# Patient Record
Sex: Female | Born: 1990 | Race: White | Hispanic: No | Marital: Married | Smoking: Never smoker
Health system: Southern US, Community
[De-identification: ages and names within clinical notes are randomized; demographics above are authoritative.]

## PROBLEM LIST (undated history)

## (undated) DIAGNOSIS — F909 Attention-deficit hyperactivity disorder, unspecified type: Secondary | ICD-10-CM

## (undated) HISTORY — DX: Attention-deficit hyperactivity disorder, unspecified type: F90.9

---

## 2012-12-15 ENCOUNTER — Ambulatory Visit (INDEPENDENT_AMBULATORY_CARE_PROVIDER_SITE_OTHER): Payer: BC Managed Care – PPO | Admitting: Internal Medicine

## 2012-12-15 VITALS — BP 127/81 | HR 74 | Temp 98.1°F | Resp 16 | Ht 63.0 in | Wt 123.2 lb

## 2012-12-15 DIAGNOSIS — F988 Other specified behavioral and emotional disorders with onset usually occurring in childhood and adolescence: Secondary | ICD-10-CM

## 2012-12-15 MED ORDER — AMPHETAMINE-DEXTROAMPHETAMINE 10 MG PO TABS: 10.0000 mg | ORAL_TABLET | Freq: Two times a day (BID) | ORAL | Status: DC

## 2012-12-15 NOTE — Progress Notes (Signed)
  Subjective:    Patient ID: Heather Munoz, female    DOB: 1991/02/25, 21 y.o.   MRN: [id number]  HPI referred by her sisters for evaluation for possible attention deficit  3rd yr [employer]  Biology.?nutrition-over this past semester, after all A's in high school, great SAT scores and good grades the first 2 years of college, she has started having trouble with completion of homework in a timely fashion, and with attending to lectures and maintaining an ability to stay focused and take class notes. She hurries through tests and makes careless errors at times. The burden of her cumulative academic work is now interfering with adequate sleep. Sisters both ADD She is the quiet one/not a whiner/Mom often pushes things on her when th eothers complain Trouble misplacing everything/loses place in [employer] track in lectures Inefficient with homework staying up way too late Others say her "mind is elsewhere"  There are no other medical issues She is on oral contraceptives/no relationship issues  No illicit drugs/no risk behavior No depression or anxiety although she does avoid coming home because of the interaction with her mother Father left when she was an infant/she had contact with him through age 43 but very little since/she does not see this as a problem     Review of Systems  Constitutional: Negative for fatigue and unexpected weight change.       Remains very active as a runner/no change in recent activity levels  HENT: Negative for dental problem.   Eyes: Negative for visual disturbance.  Respiratory: Negative for shortness of breath.   Cardiovascular: Negative for chest pain and palpitations.  Gastrointestinal: Negative for abdominal pain.  Genitourinary: Negative for menstrual problem.  Neurological: Negative for headaches.  Psychiatric/Behavioral: Negative for suicidal ideas, hallucinations, behavioral problems, sleep disturbance, self-injury and dysphoric mood. The  patient is not nervous/anxious and is not hyperactive.        Objective:   Physical Exam Vital signs stable HEENT clear No thyromegaly or nodules Heart regular with a rate of 65 and no murmurs or clicks Neurological intact Mood stable/affect appropriate  ASRS: 47 equal highly likely to have ADD There are no compulsions and no disruptive behaviors but a high degree of restlessness, talking too much, finishing sentences of other people. Most markedly there are reports of careless mistakes especially when bored, difficulty concentration, procrastination, misplacing things, and being unable to sit for a long period without restlessness     Assessment & Plan:  Problem #1 attention deficit disorder  To begin a trial of Adderall 10 mg for 4-6 hour class or study., #60 Referred to ADD for dummies/we discussed the value of the way she thinks Call from college with report of medication response so I can arrange refills with followup during a college vacation The focus should be to make her better in class, much more efficient with homework, improved with reading, and improved with procrastination.

## 2012-12-16 ENCOUNTER — Encounter: Payer: Self-pay | Admitting: Internal Medicine

## 2012-12-16 DIAGNOSIS — F988 Other specified behavioral and emotional disorders with onset usually occurring in childhood and adolescence: Secondary | ICD-10-CM | POA: Insufficient documentation

## 2013-03-17 ENCOUNTER — Telehealth: Payer: Self-pay

## 2013-03-17 DIAGNOSIS — F988 Other specified behavioral and emotional disorders with onset usually occurring in childhood and adolescence: Secondary | ICD-10-CM

## 2013-03-17 MED ORDER — AMPHETAMINE-DEXTROAMPHETAMINE 10 MG PO TABS: 10.0000 mg | ORAL_TABLET | Freq: Two times a day (BID) | ORAL | Status: DC

## 2013-03-17 NOTE — Telephone Encounter (Signed)
Meds ordered this encounter  Medications  . amphetamine-dextroamphetamine (ADDERALL) 10 MG tablet    Sig: Take 1 tablet (10 mg total) by mouth 2 (two) times daily.    Dispense:  60 tablet    Refill:  0   F/u summer

## 2013-03-17 NOTE — Telephone Encounter (Signed)
Pended please advise.  

## 2013-03-17 NOTE — Telephone Encounter (Signed)
Refill amphetamine-dextroamphetamine (ADDERALL) 10 MG tablet   [phone] Clide Dales - Mother

## 2013-03-20 NOTE — Telephone Encounter (Signed)
Mom advised

## 2013-05-25 ENCOUNTER — Telehealth: Payer: Self-pay

## 2013-05-25 DIAGNOSIS — F988 Other specified behavioral and emotional disorders with onset usually occurring in childhood and adolescence: Secondary | ICD-10-CM

## 2013-05-25 NOTE — Telephone Encounter (Signed)
Pended please advise.  

## 2013-05-25 NOTE — Telephone Encounter (Signed)
Patients mother Misty Stanley called requesting a refill for her daughters Adderall. Please call back at [phone] for further questions. Thanks

## 2013-05-29 MED ORDER — AMPHETAMINE-DEXTROAMPHETAMINE 10 MG PO TABS: 10.0000 mg | ORAL_TABLET | Freq: Two times a day (BID) | ORAL | Status: DC

## 2013-05-29 NOTE — Telephone Encounter (Signed)
Needs f/u before further rx after this Meds ordered this encounter  Medications  . amphetamine-dextroamphetamine (ADDERALL) 10 MG tablet    Sig: Take 1 tablet (10 mg total) by mouth 2 (two) times daily.    Dispense:  60 tablet    Refill:  0    Needs follow up for refills.

## 2013-05-30 NOTE — Telephone Encounter (Signed)
Pt mom notified that rx is ready for pickup. rx is in pickup drawer.

## 2013-08-03 ENCOUNTER — Telehealth: Payer: Self-pay

## 2013-08-03 NOTE — Telephone Encounter (Signed)
Med Refills - adderall    [phone]

## 2013-09-08 ENCOUNTER — Telehealth: Payer: Self-pay

## 2013-09-08 DIAGNOSIS — F988 Other specified behavioral and emotional disorders with onset usually occurring in childhood and adolescence: Secondary | ICD-10-CM

## 2013-09-08 NOTE — Telephone Encounter (Signed)
Pt's mother is calling to see about getting her daughters adderal refilled Call back number is [phone]

## 2013-09-08 NOTE — Telephone Encounter (Signed)
Pt advised of need for appt, she is provided Dr Doolittle's hours, as it is best for her to come in on a weekend. She plans to come in next weekend, please advise on refill request, I have pended the medication

## 2013-09-08 NOTE — Telephone Encounter (Signed)
Called her

## 2013-09-08 NOTE — Telephone Encounter (Signed)
In June, was advised that she needs follow-up with Dr. Merla Riches.  What's the plan?

## 2013-09-08 NOTE — Telephone Encounter (Signed)
Left message for patient to call me back. 

## 2013-09-12 MED ORDER — AMPHETAMINE-DEXTROAMPHETAMINE 10 MG PO TABS: 10.0000 mg | ORAL_TABLET | Freq: Two times a day (BID) | ORAL | Status: DC

## 2013-09-12 NOTE — Telephone Encounter (Signed)
Pt herself CB to check status of Rx. Dr Merla Riches, have you seen this request? It looks like it was routed but I didn't see it in your in basket. Please advise and I will call pt back.

## 2013-09-12 NOTE — Telephone Encounter (Signed)
Patient's mother notified and voiced understanding.

## 2013-09-12 NOTE — Telephone Encounter (Signed)
Meds ordered this encounter  Medications  . amphetamine-dextroamphetamine (ADDERALL) 10 MG tablet    Sig: Take 1 tablet (10 mg total) by mouth 2 (two) times daily.    Dispense:  60 tablet    Refill:  0    Needs follow up for refills.

## 2013-09-13 NOTE — Telephone Encounter (Signed)
No further action needed.

## 2013-10-24 ENCOUNTER — Ambulatory Visit (INDEPENDENT_AMBULATORY_CARE_PROVIDER_SITE_OTHER): Payer: BC Managed Care – PPO | Admitting: Internal Medicine

## 2013-10-24 VITALS — BP 102/68 | HR 70 | Temp 97.3°F | Resp 16 | Ht 64.0 in | Wt 124.0 lb

## 2013-10-24 DIAGNOSIS — F988 Other specified behavioral and emotional disorders with onset usually occurring in childhood and adolescence: Secondary | ICD-10-CM

## 2013-10-24 MED ORDER — AMPHETAMINE-DEXTROAMPHETAMINE 10 MG PO TABS: 10.0000 mg | ORAL_TABLET | Freq: Two times a day (BID) | ORAL | Status: DC

## 2013-10-24 NOTE — Progress Notes (Signed)
Subjective:    Patient ID: Heather Munoz, female    DOB: Apr 16, 1991, 22 y.o.   MRN: [id number] This chart was scribed for Dr. Merla Riches by Valera Castle, ED Scribe. This patient was seen in room 05 and the patient's care was started at 9:18 AM.  HPI Heather Munoz is a 22 y.o. female with a h/o ADD who presents to the [facility] for a follow up. She reports being unaware it was time for her to revisit. She reports she was first seen here last 11/2012. She confirms calling here several times to get her Rx refills.  She reports that her grades have been improving due to the medication, and that it is not as hard to focus on her tasks. She states she doesn't lose track of time as much as she used to.  She states she has been taking it once a day. She reports that she was told if she needs to she can take the medication twice a day, but does not usually take it more than once a day. She reports that sometimes she feels foggy, during the wear off phase. She states it takes about 5 hours for the medication to wear off.   She denies any other complaints, stating being healthy otherwise. She states she hopes to attend graduate school soon. 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 Attention deficit disorder 12/16/2012   History reviewed. No pertinent past medical history. History reviewed. No pertinent past surgical history. No Known Allergies Prior to Admission medications   Medication Sig Start Date End Date Taking? Authorizing Provider  amphetamine-dextroamphetamine (ADDERALL) 10 MG tablet Take 1 tablet (10 mg total) by mouth 2 (two) times daily. 09/08/13  Yes Tonye Pearson, MD  norethindrone-ethinyl estradiol (JUNEL FE,GILDESS FE,LOESTRIN FE) 1-20 MG-MCG tablet Take 1 tablet by mouth daily.   Yes Historical Provider, MD    Review of Systems     Objective:   Physical Exam  Constitutional: She is oriented to person,  place, and time. She appears well-developed and well-nourished.  HENT:  Head: Normocephalic and atraumatic.  Eyes: Conjunctivae and EOM are normal. Pupils are equal, round, and reactive to light.  Cardiovascular: Normal rate and regular rhythm.   Pulmonary/Chest: Effort normal.  Neurological: She is alert and oriented to person, place, and time. No cranial nerve deficit.  Psychiatric: She has a normal mood and affect. Her behavior is normal. Judgment and thought content normal.    Filed Vitals:   10/24/13 0830  BP: 102/68  Pulse: 70  Temp: 97.3 F (36.3 C)  TempSrc: Oral  Resp: 16  Height: 5\' 4"  (1.626 m)  Weight: 124 lb (56.246 kg)  SpO2: 97%       Assessment & Plan:   ADD (attention deficit disorder) - Plan: amphetamine-dextroamphetamine (ADDERALL) 10 MG tablet, amphetamine-dextroamphetamine (ADDERALL) 10 MG tablet, amphetamine-dextroamphetamine (ADDERALL) 10 MG tablet  Meds ordered this encounter  Medications  . amphetamine-dextroamphetamine (ADDERALL) 10 MG tablet    Sig: Take 1 tablet (10 mg total) by mouth 2 (two) times daily.    Dispense:  60 tablet    Refill:  0      . amphetamine-dextroamphetamine (ADDERALL) 10 MG tablet    Sig: Take 1 tablet (10 mg total) by mouth 2 (two) times daily.    Dispense:  60 tablet    Refill:  0   11/23/13  . amphetamine-dextroamphetamine (ADDERALL) 10 MG tablet  Sig: Take 1 tablet (10 mg total) by mouth 2 (two) times daily.    Dispense:  60 tablet    Refill:  0    12/24/13   Call for more in 3-4 mos F/u prior to grad school   I have completed the patient encounter in its entirety as documented by the scribe, with editing by me where necessary. Madelline Eshbach P. Merla Riches, M.D.

## 2014-04-06 ENCOUNTER — Telehealth: Payer: Self-pay

## 2014-04-06 DIAGNOSIS — F988 Other specified behavioral and emotional disorders with onset usually occurring in childhood and adolescence: Secondary | ICD-10-CM

## 2014-04-06 MED ORDER — AMPHETAMINE-DEXTROAMPHETAMINE 10 MG PO TABS: 10.0000 mg | ORAL_TABLET | Freq: Two times a day (BID) | ORAL | Status: DC

## 2014-04-06 NOTE — Telephone Encounter (Signed)
Mom called; daughter needs refill on Adderrall. Mom will pick up when ready; [facility]  Please call Romero BellingStacey Beard at [phone]

## 2014-04-07 NOTE — Telephone Encounter (Signed)
lmom that rx is ready for pickup.  

## 2014-09-17 ENCOUNTER — Ambulatory Visit (INDEPENDENT_AMBULATORY_CARE_PROVIDER_SITE_OTHER): Payer: BC Managed Care – PPO | Admitting: Internal Medicine

## 2014-09-17 VITALS — BP 110/70 | HR 79 | Temp 98.5°F | Resp 14 | Ht 63.5 in | Wt 130.6 lb

## 2014-09-17 DIAGNOSIS — F988 Other specified behavioral and emotional disorders with onset usually occurring in childhood and adolescence: Secondary | ICD-10-CM

## 2014-09-17 MED ORDER — AMPHETAMINE-DEXTROAMPHETAMINE 10 MG PO TABS: 10.0000 mg | ORAL_TABLET | Freq: Two times a day (BID) | ORAL | Status: DC

## 2014-09-17 NOTE — Progress Notes (Signed)
Subjective:   This chart was scribed for Heather Sia, MD by Arlan Organ, [facility] Scribe. This patient was seen in room 2 and the patient's care was started 6:10 PM.    Patient ID: Heather Munoz, female    DOB: [birth year], 23 y.o.   MRN: [id number]  Chief Complaint  Patient presents with   Medication Refill    Adderall    HPI  HPI Comments: Heather Munoz is a 23 y.o. female who presents to [facility] here for Adderall medication refill today. Last refill November 2014. Pt states she is healthy otherwise. Heather Munoz plans to apply to gradate school next year and is currently working at [employer]. She is now preparing to take the GRE prior to admission to graduate school. Daily dose varies dep on need. No side effects  Doing well otherwise.  psychosoc stable  Patient Active Problem List   Diagnosis Date Noted   Attention deficit disorder 12/16/2012   History reviewed. No pertinent past medical history. History reviewed. No pertinent past surgical history. No Known Allergies Prior to Admission medications   Medication Sig Start Date End Date Taking? Authorizing Provider  amphetamine-dextroamphetamine (ADDERALL) 10 MG tablet Take 1 tablet (10 mg total) by mouth 2 (two) times daily. For 60 d after signed 04/06/14  Yes Tonye Pearson, MD  amphetamine-dextroamphetamine (ADDERALL) 10 MG tablet Take 1 tablet (10 mg total) by mouth 2 (two) times daily. For 30d after signed 04/06/14  Yes Tonye Pearson, MD  amphetamine-dextroamphetamine (ADDERALL) 10 MG tablet Take 1 tablet (10 mg total) by mouth 2 (two) times daily. 04/06/14  Yes Tonye Pearson, MD  norethindrone-ethinyl estradiol (JUNEL FE,GILDESS FE,LOESTRIN FE) 1-20 MG-MCG tablet Take 1 tablet by mouth daily.   Yes Historical Provider, MD     Review of Systems  Constitutional: Negative for fever and chills.  no HA or vis chg No CP or Palp No  loss appet No tremor or sweating or insomnia  Triage Vitals: BP 110/70   Pulse 79   Temp(Src) 98.5 F (36.9 C) (Oral)   Resp 14   Ht 5' 3.5" (1.613 m)   Wt 130 lb 9.6 oz (59.24 kg)   BMI 22.77 kg/m2   SpO2 99%   LMP 09/16/2014   Objective:  Physical Exam  Nursing note and vitals reviewed. Constitutional: She is oriented to person, place, and time. She appears well-developed and well-nourished.  HENT:  Head: Normocephalic.  Eyes: Conjunctivae and EOM are normal. Pupils are equal, round, and reactive to light.  Neck: Normal range of motion.  Pulmonary/Chest: Effort normal.  Neurological: She is alert and oriented to person, place, and time. No cranial nerve deficit.  Psychiatric: She has a normal mood and affect. Her behavior is normal. Judgment and thought content normal.    Assessment & Plan:   I personally performed the services described in this documentation, which was scribed in my presence. The recorded information has been reviewed and is accurate.   ADD (attention deficit disorder) - Plan: amphetamine-dextroamphetamine (ADDERALL) 10 MG tablet, amphetamine-dextroamphetamine (ADDERALL) 10 MG tablet, amphetamine-dextroamphetamine (ADDERALL) 10 MG tablet  Meds ordered this encounter  Medications   amphetamine-dextroamphetamine (ADDERALL) 10 MG tablet    Sig: Take 1 tablet (10 mg total) by mouth 2 (two) times daily. For 60 d after signed    Dispense:  60 tablet    Refill:  0   amphetamine-dextroamphetamine (ADDERALL) 10 MG tablet  Sig: Take 1 tablet (10 mg total) by mouth 2 (two) times daily. For 30d after signed    Dispense:  60 tablet    Refill:  0   amphetamine-dextroamphetamine (ADDERALL) 10 MG tablet    Sig: Take 1 tablet (10 mg total) by mouth 2 (two) times daily.    Dispense:  60 tablet    Refill:  0   Call 3 f/u 70mo

## 2015-01-29 ENCOUNTER — Telehealth: Payer: Self-pay

## 2015-01-29 DIAGNOSIS — F988 Other specified behavioral and emotional disorders with onset usually occurring in childhood and adolescence: Secondary | ICD-10-CM

## 2015-01-29 MED ORDER — AMPHETAMINE-DEXTROAMPHETAMINE 10 MG PO TABS: 10.0000 mg | ORAL_TABLET | Freq: Two times a day (BID) | ORAL | Status: DC

## 2015-01-29 NOTE — Telephone Encounter (Signed)
Meds ordered this encounter  Medications  . amphetamine-dextroamphetamine (ADDERALL) 10 MG tablet    Sig: Take 1 tablet (10 mg total) by mouth 2 (two) times daily.    Dispense:  60 tablet    Refill:  0  . amphetamine-dextroamphetamine (ADDERALL) 10 MG tablet    Sig: Take 1 tablet (10 mg total) by mouth 2 (two) times daily. For 30d after signed    Dispense:  60 tablet    Refill:  0  . amphetamine-dextroamphetamine (ADDERALL) 10 MG tablet    Sig: Take 1 tablet (10 mg total) by mouth 2 (two) times daily. For 60 d after signed    Dispense:  60 tablet    Refill:  0   F/u next refills

## 2015-01-29 NOTE — Telephone Encounter (Signed)
Heather ArnoldStacy states her daughter is in need of her ADDERALL 10mg s. Please call [phone] when ready for pick up

## 2015-01-30 NOTE — Telephone Encounter (Signed)
lmom that rx's are ready to be picked up.

## 2015-03-13 ENCOUNTER — Ambulatory Visit: Payer: Self-pay

## 2015-03-13 ENCOUNTER — Other Ambulatory Visit: Payer: Self-pay | Admitting: Occupational Medicine

## 2015-03-13 DIAGNOSIS — Z021 Encounter for pre-employment examination: Secondary | ICD-10-CM

## 2015-11-18 ENCOUNTER — Encounter: Payer: Self-pay | Admitting: Internal Medicine

## 2016-04-03 IMAGING — CR DG CHEST 1V
1 series · 1 of 1 positions shown · non-contrast
Comparison: None

CLINICAL DATA: Pre-employment physical

EXAM:
CHEST  1 VIEW

[view not recorded]
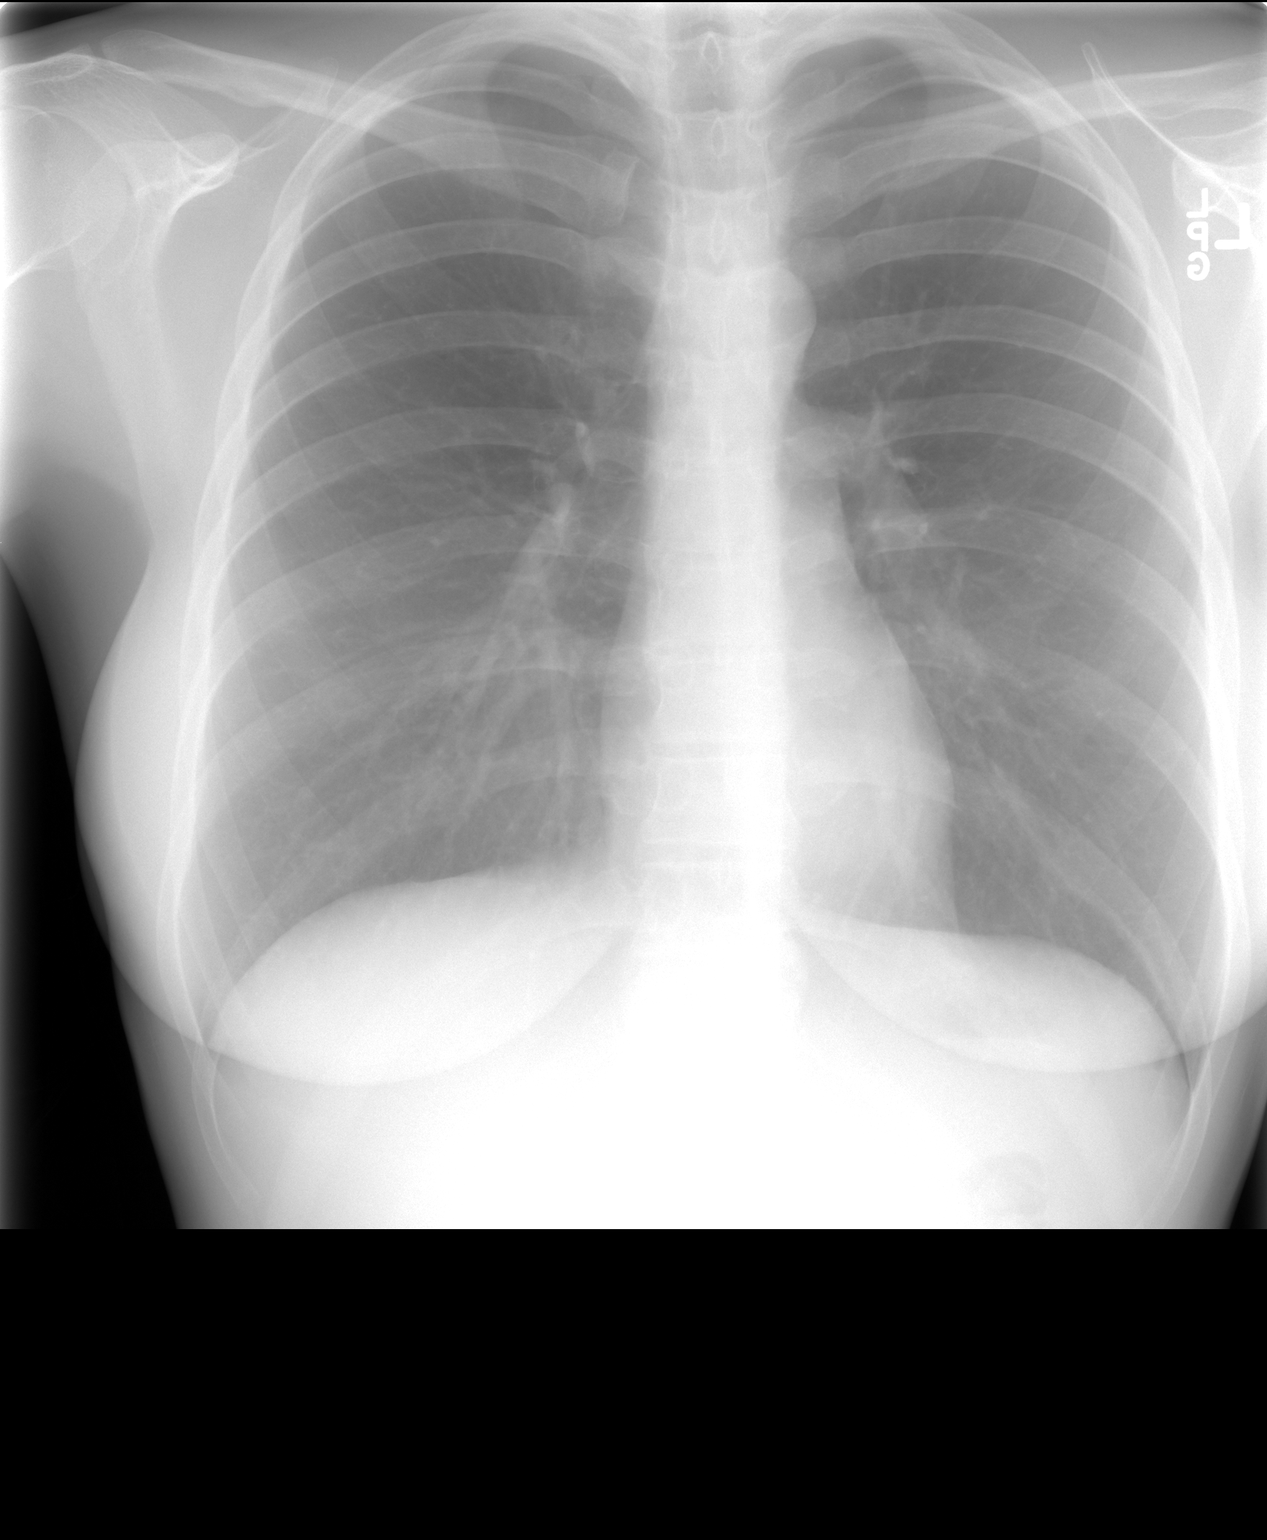

[1 of 1 positions shown; findings below may reference images not displayed]

FINDINGS: Normal heart size, mediastinal contours, and pulmonary vascularity.

Lungs well expanded and clear.

No pleural effusion or pneumothorax.

Bones unremarkable.
IMPRESSION: Normal exam.

## 2016-04-06 ENCOUNTER — Ambulatory Visit (INDEPENDENT_AMBULATORY_CARE_PROVIDER_SITE_OTHER): Payer: BLUE CROSS/BLUE SHIELD | Admitting: Internal Medicine

## 2016-04-06 VITALS — BP 122/74 | HR 69 | Temp 98.0°F | Resp 15 | Ht 63.5 in | Wt 127.2 lb

## 2016-04-06 DIAGNOSIS — F909 Attention-deficit hyperactivity disorder, unspecified type: Secondary | ICD-10-CM

## 2016-04-06 DIAGNOSIS — F988 Other specified behavioral and emotional disorders with onset usually occurring in childhood and adolescence: Secondary | ICD-10-CM

## 2016-04-06 MED ORDER — AMPHETAMINE-DEXTROAMPHETAMINE 10 MG PO TABS: 10.0000 mg | ORAL_TABLET | Freq: Two times a day (BID) | ORAL | Status: DC

## 2016-04-06 NOTE — Patient Instructions (Signed)
     IF you received an x-ray today, you will receive an invoice from [facility]. Please contact [facility] at [phone] with questions or concerns regarding your invoice.   IF you received labwork today, you will receive an invoice from [facility]. Please contact [facility] at [phone] with questions or concerns regarding your invoice.   Our billing staff will not be able to assist you with questions regarding bills from these companies.  You will be contacted with the lab results as soon as they are available. The fastest way to get your results is to activate your My Chart account. Instructions are located on the last page of this paperwork. If you have not heard from us regarding the results in 2 weeks, please contact this office.      

## 2016-04-06 NOTE — Progress Notes (Signed)
By signing my name below I, Shelah Lewandowsky, attest that this documentation has been prepared under the direction and in the presence of Ellamae Sia, MD. Electonically Signed. Shelah Lewandowsky, Scribe 04/06/2016 at 1:31 PM  Subjective:    Patient ID: Heather Munoz, female    DOB: [birth year], 25 y.o.   MRN: [id number] Chief Complaint  Patient presents with  . Medication Refill    Adderall     HPI Heather Munoz is a 25 y.o. female who presents to the [facility] for adderall refill and to discuss new provider options. Pt states she is healthy and has no complaints.   Pt has not been using adderall for the past couple years and has been doing well. 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 Pt is going back to school and would like to start back on adderall for classes.  Just got accepted to [employer] program and starts May 04 2016. Pt will be doing 2 classes a week at night after work.   Both sisters ADD Patient Active Problem List   Diagnosis Date Noted  . Attention deficit disorder 12/16/2012    Current outpatient prescriptions:  .  amphetamine-dextroamphetamine (ADDERALL) 10 MG tablet, Take 1 tablet (10 mg total) by mouth 2 (two) times daily. For 60 d after signed, Disp: 60 tablet, Rfl: 0 .  norethindrone-ethinyl estradiol (JUNEL FE,GILDESS FE,LOESTRIN FE) 1-20 MG-MCG tablet, Take 1 tablet by mouth daily., Disp: , Rfl:   No Known Allergies   Social History   Social History  . Marital Status: Single    Spouse Name: N/A  . Number of Children: N/A  . Years of Education: N/A   Occupational History  . Not on file.   Social History Main Topics  . Smoking status: Never Smoker   . Smokeless tobacco: Not on file  . Alcohol Use: Not on file  . Drug Use: Not on file  . Sexual Activity: Not on file   Other Topics Concern  . Not on file    Social History Narrative      Review of Systems  [place]     Objective:   Physical Exam  Constitutional: She is oriented to person, place, and time. She appears well-developed and well-nourished. No distress.  HENT:  Head: Normocephalic and atraumatic.  Eyes: Conjunctivae and EOM are normal. Pupils are equal, round, and reactive to light.  Neck: Neck supple.  Cardiovascular: Normal rate.   Pulmonary/Chest: Effort normal.  Musculoskeletal: Normal range of motion.  Neurological: She is alert and oriented to person, place, and time. Gait normal.  Skin: Skin is warm and dry.  Psychiatric: She has a normal mood and affect. Her behavior is normal.  Nursing note and vitals reviewed.  Filed Vitals:   04/06/16 1151  BP: 122/74  Pulse: 69  Temp: 98 F (36.7 C)  Resp: 15          Assessment & Plan:  I have completed the patient encounter in its entirety as documented by the scribe, with editing by me where necessary. Jarica Plass P. Merla Riches, M.D.  ADD (attention deficit disorder) - Plan: amphetamine-dextroamphetamine (ADDERALL) 10 MG tablet, amphetamine-dextroamphetamine (ADDERALL) 10 MG tablet, amphetamine-dextroamphetamine (ADDERALL) 10 MG tablet  Meds ordered this encounter  Medications  . amphetamine-dextroamphetamine (ADDERALL) 10 MG tablet    Sig: Take 1 tablet (10 mg total) by  mouth 2 (two) times daily. For 60 d after signed    Dispense:  60 tablet    Refill:  0  . amphetamine-dextroamphetamine (ADDERALL) 10 MG tablet    Sig: Take 1 tablet (10 mg total) by mouth 2 (two) times daily. For 30d after signed    Dispense:  60 tablet    Refill:  0  . amphetamine-dextroamphetamine (ADDERALL) 10 MG tablet    Sig: Take 1 tablet (10 mg total) by mouth 2 (two) times daily.    Dispense:  60 tablet    Refill:  0   F/u S Weber(prob won't need meds but 2 d a week)

## 2016-08-12 ENCOUNTER — Other Ambulatory Visit: Payer: Self-pay

## 2016-08-12 DIAGNOSIS — F988 Other specified behavioral and emotional disorders with onset usually occurring in childhood and adolescence: Secondary | ICD-10-CM

## 2016-08-12 NOTE — Telephone Encounter (Signed)
Patient request a refill of Adderall 10 MG. [phone].

## 2016-08-13 NOTE — Telephone Encounter (Signed)
RTC to establish with new provider or I can prescribe a thirty day supply and get her a referral to [facility]. Please give her the choice.

## 2016-08-13 NOTE — Telephone Encounter (Signed)
Pt last seen for ADD by Merla Richesoolittle 4/17 and given 3 Rxs. I will pend for 3 more.

## 2016-08-14 ENCOUNTER — Telehealth: Payer: Self-pay

## 2016-08-14 ENCOUNTER — Other Ambulatory Visit: Payer: Self-pay | Admitting: Internal Medicine

## 2016-08-14 DIAGNOSIS — F988 Other specified behavioral and emotional disorders with onset usually occurring in childhood and adolescence: Secondary | ICD-10-CM

## 2016-08-14 NOTE — Telephone Encounter (Signed)
LMOM for pt advising that she either RTC to est care with new provider here, call first to set up same day appt.. Or call if she'd rather have referral to [facility] in which case we will start referral and get her a 30 day RF.

## 2016-08-14 NOTE — Telephone Encounter (Signed)
Patient is returning phone call concerning Adderall. [phone].

## 2016-08-15 MED ORDER — AMPHETAMINE-DEXTROAMPHETAMINE 10 MG PO TABS: 10.0000 mg | ORAL_TABLET | Freq: Two times a day (BID) | ORAL | 0 refills | Status: DC

## 2016-08-15 NOTE — Telephone Encounter (Signed)
I will give to patient but she will need to see me before more refills are given

## 2016-08-17 NOTE — Telephone Encounter (Signed)
Rx in drawer, pt notified by Maralyn SagoSarah on [facility]

## 2016-08-17 NOTE — Telephone Encounter (Signed)
Spoke with pt, she states she received Rx already.

## 2016-09-25 ENCOUNTER — Ambulatory Visit: Payer: BLUE CROSS/BLUE SHIELD

## 2016-10-01 ENCOUNTER — Ambulatory Visit (INDEPENDENT_AMBULATORY_CARE_PROVIDER_SITE_OTHER): Payer: BLUE CROSS/BLUE SHIELD | Admitting: Physician Assistant

## 2016-10-01 ENCOUNTER — Encounter: Payer: Self-pay | Admitting: Physician Assistant

## 2016-10-01 DIAGNOSIS — F988 Other specified behavioral and emotional disorders with onset usually occurring in childhood and adolescence: Secondary | ICD-10-CM

## 2016-10-01 MED ORDER — AMPHETAMINE-DEXTROAMPHETAMINE 10 MG PO TABS: 10.0000 mg | ORAL_TABLET | Freq: Two times a day (BID) | ORAL | 0 refills | Status: DC

## 2016-10-01 NOTE — Progress Notes (Signed)
   Heather Munoz  MRN: [id number] DOB: 1991-04-19  Subjective:  Pt presents to clinic for medication refill.  She has been doing well on her current dose.  She takes bid on weekdays and then on weekends she finds that she only takes a dose in the am and she is able to get all her work done.  She finds that at home if she does not take it she starts multiple projects but finishes none of what she starts.    Review of Systems  Constitutional: Negative for appetite change, chills, fever and unexpected weight change.  Cardiovascular: Negative for chest pain and palpitations.    Patient Active Problem List   Diagnosis Date Noted  . Attention deficit disorder 12/16/2012    Current Outpatient Prescriptions on File Prior to Visit  Medication Sig Dispense Refill  . norethindrone-ethinyl estradiol (JUNEL FE,GILDESS FE,LOESTRIN FE) 1-20 MG-MCG tablet Take 1 tablet by mouth daily.     No current facility-administered medications on file prior to visit.     No Known Allergies  Pt patients past, family and social history were reviewed and updated.  Objective:  BP 118/64 (BP Location: Right Arm, Patient Position: Sitting, Cuff Size: Small)   Pulse 76   Temp 98.9 F (37.2 C) (Oral)   Resp 16   Ht 5' 3.5" (1.613 m)   Wt 120 lb (54.4 kg)   LMP 09/17/2016   SpO2 99%   BMI 20.92 kg/m   Physical Exam  Constitutional: She is oriented to person, place, and time and well-developed, well-nourished, and in no distress.  HENT:  Head: Normocephalic and atraumatic.  Right Ear: Hearing and external ear normal.  Left Ear: Hearing and external ear normal.  Eyes: Conjunctivae are normal.  Neck: Normal range of motion.  Cardiovascular: Normal rate, regular rhythm and normal heart sounds.   No murmur heard. Pulmonary/Chest: Effort normal and breath sounds normal. She has no wheezes.  Neurological: She is alert and oriented to person, place, and time. Gait normal.  Skin: Skin is warm and dry.    Psychiatric: Mood, memory, affect and judgment normal.  Vitals reviewed.   Assessment and Plan :  Attention deficit disorder (ADD) without hyperactivity - Plan: amphetamine-dextroamphetamine (ADDERALL) 10 MG tablet, amphetamine-dextroamphetamine (ADDERALL) 10 MG tablet, amphetamine-dextroamphetamine (ADDERALL) 10 MG tablet   Continue with current dose - she will call in 3 months for more Rx   Benny LennertSarah Mitsuo Budnick PA-C  [facility] 10/02/2016 10:10 AM

## 2016-10-01 NOTE — Patient Instructions (Signed)
     IF you received an x-ray today, you will receive an invoice from [facility]. Please contact [facility] at [phone] with questions or concerns regarding your invoice.   IF you received labwork today, you will receive an invoice from [facility]. Please contact [facility] at [phone] with questions or concerns regarding your invoice.   Our billing staff will not be able to assist you with questions regarding bills from these companies.  You will be contacted with the lab results as soon as they are available. The fastest way to get your results is to activate your My Chart account. Instructions are located on the last page of this paperwork. If you have not heard from us regarding the results in 2 weeks, please contact this office.      

## 2016-12-31 ENCOUNTER — Other Ambulatory Visit: Payer: Self-pay | Admitting: Physician Assistant

## 2016-12-31 DIAGNOSIS — F988 Other specified behavioral and emotional disorders with onset usually occurring in childhood and adolescence: Secondary | ICD-10-CM

## 2017-01-01 MED ORDER — AMPHETAMINE-DEXTROAMPHETAMINE 10 MG PO TABS: 10.0000 mg | ORAL_TABLET | Freq: Two times a day (BID) | ORAL | 0 refills | Status: DC

## 2017-01-01 NOTE — Telephone Encounter (Signed)
Rx ready - please have the patient make an appt with me in 3 months before these run out

## 2017-01-05 NOTE — Telephone Encounter (Signed)
Up front 

## 2017-03-25 ENCOUNTER — Encounter: Payer: Self-pay | Admitting: Physician Assistant

## 2017-03-25 DIAGNOSIS — F988 Other specified behavioral and emotional disorders with onset usually occurring in childhood and adolescence: Secondary | ICD-10-CM

## 2017-03-29 MED ORDER — AMPHETAMINE-DEXTROAMPHETAMINE 10 MG PO TABS: 10.0000 mg | ORAL_TABLET | Freq: Two times a day (BID) | ORAL | 0 refills | Status: DC

## 2017-03-29 NOTE — Telephone Encounter (Signed)
I will be in this am to sign

## 2017-04-06 ENCOUNTER — Encounter: Payer: Self-pay | Admitting: Physician Assistant

## 2017-04-06 ENCOUNTER — Ambulatory Visit (INDEPENDENT_AMBULATORY_CARE_PROVIDER_SITE_OTHER): Payer: Managed Care, Other (non HMO) | Admitting: Physician Assistant

## 2017-04-06 DIAGNOSIS — F988 Other specified behavioral and emotional disorders with onset usually occurring in childhood and adolescence: Secondary | ICD-10-CM | POA: Diagnosis not present

## 2017-04-06 MED ORDER — AMPHETAMINE-DEXTROAMPHETAMINE 10 MG PO TABS: 10.0000 mg | ORAL_TABLET | Freq: Two times a day (BID) | ORAL | 0 refills | Status: DC

## 2017-04-06 NOTE — Assessment & Plan Note (Signed)
Doing well with current medication regimen - call in 3 months for her next set of Rx - recheck appt with me in 6 months.

## 2017-04-06 NOTE — Progress Notes (Signed)
   Heather Munoz  MRN: [id number] DOB: 30-Aug-1991  PCP: Virgilio Belling  Chief Complaint  Patient presents with  . Medication Refill    adderall    Subjective:  Pt presents to clinic for medication refill.  She is doing well with her current medications.  Review of Systems  Constitutional: Negative for appetite change, chills, fever and unexpected weight change.  Cardiovascular: Negative for chest pain and palpitations.  Psychiatric/Behavioral: Negative for sleep disturbance.    Patient Active Problem List   Diagnosis Date Noted  . Attention deficit disorder 12/16/2012    Current Outpatient Prescriptions on File Prior to Visit  Medication Sig Dispense Refill  . norethindrone-ethinyl estradiol (JUNEL FE,GILDESS FE,LOESTRIN FE) 1-20 MG-MCG tablet Take 1 tablet by mouth daily.     No current facility-administered medications on file prior to visit.     No Known Allergies  Pt patients past, family and social history were reviewed and updated.   Objective:  BP 100/66   Pulse 97   Temp 98.1 F (36.7 C) (Oral)   Resp 16   Ht  (1.6 m)   Wt 115 lb (52.2 kg)   LMP 03/09/2017   SpO2 100%   BMI 20.37 kg/m   Physical Exam  Constitutional: She is oriented to person, place, and time and well-developed, well-nourished, and in no distress.  HENT:  Head: Normocephalic and atraumatic.  Right Ear: Hearing and external ear normal.  Left Ear: Hearing and external ear normal.  Eyes: Conjunctivae are normal.  Neck: Normal range of motion.  Cardiovascular: Normal rate, regular rhythm and normal heart sounds.   No murmur heard. Pulmonary/Chest: Effort normal and breath sounds normal. She has no wheezes.  Neurological: She is alert and oriented to person, place, and time. Gait normal.  Skin: Skin is warm and dry.  Psychiatric: Mood, memory, affect and judgment normal.  Vitals reviewed.   Assessment and Plan :   Problem List Items Addressed This Visit      Other    Attention deficit disorder   Relevant Medications   amphetamine-dextroamphetamine (ADDERALL) 10 MG tablet   amphetamine-dextroamphetamine (ADDERALL) 10 MG tablet   amphetamine-dextroamphetamine (ADDERALL) 10 MG tablet      Benny Lennert PA-C  Primary Care at [facility] 04/06/2017 3:08 PM

## 2017-04-06 NOTE — Patient Instructions (Signed)
     IF you received an x-ray today, you will receive an invoice from [facility]. Please contact [facility] at [phone] with questions or concerns regarding your invoice.   IF you received labwork today, you will receive an invoice from [facility]. Please contact [facility] at [phone] with questions or concerns regarding your invoice.   Our billing staff will not be able to assist you with questions regarding bills from these companies.  You will be contacted with the lab results as soon as they are available. The fastest way to get your results is to activate your My Chart account. Instructions are located on the last page of this paperwork. If you have not heard from us regarding the results in 2 weeks, please contact this office.     

## 2017-04-06 NOTE — Progress Notes (Signed)
   Heather Munoz  MRN: [id number] DOB: [birth year]  PCP: Virgilio Belling  Chief Complaint  Patient presents with  . Medication Refill    adderall    Subjective:  Pt presents to clinic for  Review of Systems  Constitutional: Negative for appetite change, chills, fever and unexpected weight change.  Cardiovascular: Negative for chest pain and palpitations.  Psychiatric/Behavioral: Negative for sleep disturbance.    Patient Active Problem List   Diagnosis Date Noted  . Attention deficit disorder 12/16/2012    Current Outpatient Prescriptions on File Prior to Visit  Medication Sig Dispense Refill  . amphetamine-dextroamphetamine (ADDERALL) 10 MG tablet Take 1 tablet (10 mg total) by mouth 2 (two) times daily. For 60 d after signed 60 tablet 0  . amphetamine-dextroamphetamine (ADDERALL) 10 MG tablet Take 1 tablet (10 mg total) by mouth 2 (two) times daily. For 30d after signed 60 tablet 0  . amphetamine-dextroamphetamine (ADDERALL) 10 MG tablet Take 1 tablet (10 mg total) by mouth 2 (two) times daily. 60 tablet 0  . norethindrone-ethinyl estradiol (JUNEL FE,GILDESS FE,LOESTRIN FE) 1-20 MG-MCG tablet Take 1 tablet by mouth daily.     No current facility-administered medications on file prior to visit.     No Known Allergies  Pt patients past, family and social history were reviewed and updated.   Objective:  BP 100/66   Pulse 97   Temp 98.1 F (36.7 C) (Oral)   Resp 16   Ht  (1.6 m)   Wt 115 lb (52.2 kg)   LMP 03/09/2017   SpO2 100%   BMI 20.37 kg/m   Physical Exam  Assessment and Plan :  No diagnosis found.  Benny Lennert PA-C  Primary Care at [facility] 04/06/2017 3:03 PM

## 2017-07-26 ENCOUNTER — Other Ambulatory Visit: Payer: Self-pay | Admitting: Physician Assistant

## 2017-07-26 DIAGNOSIS — F988 Other specified behavioral and emotional disorders with onset usually occurring in childhood and adolescence: Secondary | ICD-10-CM

## 2017-07-28 ENCOUNTER — Encounter: Payer: Self-pay | Admitting: Physician Assistant

## 2017-07-28 DIAGNOSIS — F988 Other specified behavioral and emotional disorders with onset usually occurring in childhood and adolescence: Secondary | ICD-10-CM

## 2017-07-30 MED ORDER — AMPHETAMINE-DEXTROAMPHETAMINE 10 MG PO TABS: 10.0000 mg | ORAL_TABLET | Freq: Two times a day (BID) | ORAL | 0 refills | Status: DC

## 2017-07-30 NOTE — Telephone Encounter (Signed)
Done leave at 104 for patient pickup - knows though mychart,

## 2017-09-13 ENCOUNTER — Telehealth: Payer: Self-pay | Admitting: Physician Assistant

## 2017-09-13 NOTE — Telephone Encounter (Signed)
Mychart message sent to patient about rescheduling her 10/05/17 apt at 120pm with Benny Lennert

## 2017-09-28 ENCOUNTER — Ambulatory Visit: Payer: Managed Care, Other (non HMO) | Admitting: Physician Assistant

## 2017-10-05 ENCOUNTER — Ambulatory Visit: Payer: Managed Care, Other (non HMO) | Admitting: Physician Assistant

## 2017-10-08 ENCOUNTER — Encounter: Payer: Self-pay | Admitting: Physician Assistant

## 2017-10-08 ENCOUNTER — Ambulatory Visit (INDEPENDENT_AMBULATORY_CARE_PROVIDER_SITE_OTHER): Payer: Managed Care, Other (non HMO) | Admitting: Physician Assistant

## 2017-10-08 DIAGNOSIS — F988 Other specified behavioral and emotional disorders with onset usually occurring in childhood and adolescence: Secondary | ICD-10-CM

## 2017-10-08 MED ORDER — AMPHETAMINE-DEXTROAMPHETAMINE 10 MG PO TABS: 10.0000 mg | ORAL_TABLET | Freq: Two times a day (BID) | ORAL | 0 refills | Status: DC

## 2017-10-08 NOTE — Patient Instructions (Signed)
     IF you received an x-ray today, you will receive an invoice from [facility]. Please contact [facility] at [phone] with questions or concerns regarding your invoice.   IF you received labwork today, you will receive an invoice from [facility]. Please contact [facility] at [phone] with questions or concerns regarding your invoice.   Our billing staff will not be able to assist you with questions regarding bills from these companies.  You will be contacted with the lab results as soon as they are available. The fastest way to get your results is to activate your My Chart account. Instructions are located on the last page of this paperwork. If you have not heard from us regarding the results in 2 weeks, please contact this office.     

## 2017-10-08 NOTE — Progress Notes (Signed)
   Heather Munoz  MRN: [id number] DOB: [birth year]  PCP: Morrell RiddleWeber, Donnella Morford L, PA-C  Chief Complaint  Patient presents with  . Medication Refill    adderall     Subjective:  Pt presents to clinic for medication refills.  She is still happy with her current dose.  Takes it bid during the week and then on the weekends tends to only take 1 dose to get things done around the house.  History is obtained by patient.  Review of Systems  Constitutional: Negative for appetite change, chills, fever and unexpected weight change.  Cardiovascular: Negative for chest pain and palpitations.  Psychiatric/Behavioral: Negative for sleep disturbance.    Patient Active Problem List   Diagnosis Date Noted  . Attention deficit disorder 12/16/2012    Current Outpatient Prescriptions on File Prior to Visit  Medication Sig Dispense Refill  . norethindrone-ethinyl estradiol (JUNEL FE,GILDESS FE,LOESTRIN FE) 1-20 MG-MCG tablet Take 1 tablet by mouth daily.     No current facility-administered medications on file prior to visit.     No Known Allergies  Past Medical History:  Diagnosis Date  . ADHD (attention deficit hyperactivity disorder)    Social History   Social History Narrative   Civil Service fast streamerMaterial coordinator at [employer]   Social History  Substance Use Topics  . Smoking status: Never Smoker  . Smokeless tobacco: Never Used  . Alcohol use Yes     Comment: rare social   family history includes ADD / ADHD in her father, sister, and sister; Healthy in her father, maternal grandfather, maternal grandmother, mother, paternal grandfather, paternal grandmother, sister, and sister.     Objective:  BP 124/70   Pulse (!) 119   Temp 98.3 F (36.8 C) (Oral)   Resp 18   Ht 5\' 3"  (1.6 m)   Wt 111 lb 3.2 oz (50.4 kg)   LMP 09/23/2017   SpO2 99%   BMI 19.70 kg/m  Body mass index is 19.7 kg/m.  Physical Exam  Constitutional: She is oriented to person, place, and time and well-developed,  well-nourished, and in no distress.  HENT:  Head: Normocephalic and atraumatic.  Right Ear: Hearing and external ear normal.  Left Ear: Hearing and external ear normal.  Eyes: Conjunctivae are normal.  Neck: Normal range of motion.  Cardiovascular: Normal rate, regular rhythm and normal heart sounds.   No murmur heard. Pulmonary/Chest: Effort normal and breath sounds normal. She has no wheezes.  Neurological: She is alert and oriented to person, place, and time. Gait normal.  Skin: Skin is warm and dry.  Psychiatric: Mood, memory, affect and judgment normal.  Vitals reviewed.   Assessment and Plan :  Attention deficit disorder (ADD) without hyperactivity - Plan: amphetamine-dextroamphetamine (ADDERALL) 10 MG tablet, amphetamine-dextroamphetamine (ADDERALL) 10 MG tablet, amphetamine-dextroamphetamine (ADDERALL) 10 MG tablet continue current medications- cal in 3 months for next set of Rx and recheck in 6 months.  Benny LennertSarah Mackinley Kiehn PA-C  Primary Care at [facility] 10/08/2017 10:06 AM

## 2017-10-29 ENCOUNTER — Ambulatory Visit: Payer: Managed Care, Other (non HMO) | Admitting: Physician Assistant

## 2018-01-24 ENCOUNTER — Other Ambulatory Visit: Payer: Self-pay | Admitting: Physician Assistant

## 2018-01-24 DIAGNOSIS — F988 Other specified behavioral and emotional disorders with onset usually occurring in childhood and adolescence: Secondary | ICD-10-CM

## 2018-01-24 MED ORDER — AMPHETAMINE-DEXTROAMPHETAMINE 10 MG PO TABS: 10.0000 mg | ORAL_TABLET | Freq: Two times a day (BID) | ORAL | 0 refills | Status: DC

## 2018-01-24 NOTE — Telephone Encounter (Signed)
Done

## 2018-04-05 ENCOUNTER — Ambulatory Visit: Payer: Managed Care, Other (non HMO) | Admitting: Physician Assistant

## 2020-11-09 ENCOUNTER — Encounter (HOSPITAL_COMMUNITY): Payer: Self-pay | Admitting: *Deleted

## 2020-11-09 ENCOUNTER — Inpatient Hospital Stay (HOSPITAL_COMMUNITY)
Admission: AD | Admit: 2020-11-09 | Discharge: 2020-11-09 | Disposition: A | Discharge status: Home / Self Care | Payer: Managed Care, Other (non HMO) | Attending: Obstetrics & Gynecology | Admitting: Obstetrics & Gynecology

## 2020-11-09 ENCOUNTER — Other Ambulatory Visit: Payer: Self-pay

## 2020-11-09 ENCOUNTER — Inpatient Hospital Stay (HOSPITAL_COMMUNITY): Payer: Managed Care, Other (non HMO)

## 2020-11-09 DIAGNOSIS — O418X10 Other specified disorders of amniotic fluid and membranes, first trimester, not applicable or unspecified: Secondary | ICD-10-CM

## 2020-11-09 DIAGNOSIS — Z6790 Unspecified blood type, Rh positive: Secondary | ICD-10-CM

## 2020-11-09 DIAGNOSIS — O209 Hemorrhage in early pregnancy, unspecified: Secondary | ICD-10-CM

## 2020-11-09 DIAGNOSIS — Z6710 Type A blood, Rh positive: Secondary | ICD-10-CM | POA: Insufficient documentation

## 2020-11-09 DIAGNOSIS — O468X1 Other antepartum hemorrhage, first trimester: Secondary | ICD-10-CM

## 2020-11-09 DIAGNOSIS — Z3A01 Less than 8 weeks gestation of pregnancy: Secondary | ICD-10-CM | POA: Insufficient documentation

## 2020-11-09 DIAGNOSIS — O208 Other hemorrhage in early pregnancy: Secondary | ICD-10-CM | POA: Insufficient documentation

## 2020-11-09 LAB — COMPREHENSIVE METABOLIC PANEL
ALT: 16 U/L (ref 0–44)
AST: 18 U/L (ref 15–41)
Albumin: 4.2 g/dL (ref 3.5–5.0)
Alkaline Phosphatase: 28 U/L — ABNORMAL LOW (ref 38–126)
Anion gap: 11 (ref 5–15)
BUN: 12 mg/dL (ref 6–20)
CO2: 20 mmol/L — ABNORMAL LOW (ref 22–32)
Calcium: 9.2 mg/dL (ref 8.9–10.3)
Chloride: 106 mmol/L (ref 98–111)
Creatinine, Ser: 0.81 mg/dL (ref 0.44–1.00)
GFR, Estimated: >60 mL/min (ref >60)
Glucose, Bld: 105 mg/dL — ABNORMAL HIGH (ref 70–99)
Potassium: 3.8 mmol/L (ref 3.5–5.1)
Sodium: 137 mmol/L (ref 135–145)
Total Bilirubin: 0.7 mg/dL (ref 0.3–1.2)
Total Protein: 6.7 g/dL (ref 6.5–8.1)

## 2020-11-09 LAB — CBC
HCT: 36.1 % (ref 36.0–46.0)
Hemoglobin: 12.5 g/dL (ref 12.0–15.0)
MCH: 31.1 pg (ref 26.0–34.0)
MCHC: 34.6 g/dL (ref 30.0–36.0)
MCV: 89.8 fL (ref 80.0–100.0)
Platelets: 287 10*3/uL (ref 150–400)
RBC: 4.02 MIL/uL (ref 3.87–5.11)
RDW: 11.6 % (ref 11.5–15.5)
WBC: 9.5 10*3/uL (ref 4.0–10.5)
nRBC: 0 % (ref 0.0–0.2)

## 2020-11-09 LAB — WET PREP, GENITAL
Clue Cells Wet Prep HPF POC: NONE SEEN
Sperm: NONE SEEN
Trich, Wet Prep: NONE SEEN
Yeast Wet Prep HPF POC: NONE SEEN

## 2020-11-09 LAB — HCG, QUANTITATIVE, PREGNANCY: hCG, Beta Chain, Quant, S: 41708 m[IU]/mL — ABNORMAL HIGH (ref <5)

## 2020-11-09 LAB — POCT PREGNANCY, URINE: Preg Test, Ur: POSITIVE — AB

## 2020-11-09 NOTE — ED Notes (Signed)
The pt is going to [facility]  Report called to rn there transport coming to take her  there

## 2020-11-09 NOTE — MAU Note (Signed)
Heather Munoz is a 29 y.o. at [redacted]w[redacted]d here in MAU reporting: over night noticed some bleeding and states that throughout the day the bleeding slowed down. Passed a clot. No pain.  LMP: 09/30/20  Onset of complaint: today  Pain score: 0/10  Vitals:   11/09/20 1745 11/09/20 1855  BP: (!) 151/95 108/73  Pulse: (!) 134 83  Resp: 16 16  Temp: 98.4 F (36.9 C) 98.5 F (36.9 C)  SpO2: 98% 99%     Lab orders placed from triage: entered by provider

## 2020-11-09 NOTE — MAU Provider Note (Signed)
History     CSN: [id number]  Arrival date and time: 11/09/20 1739   First Provider Initiated Contact with Patient 11/09/20 1931      Chief Complaint  Patient presents with  . Vaginal Bleeding   [name] is a 29 y.o. G2P0010 at [redacted]w[redacted]d who presents to MAU for vaginal bleeding which began yesterday, stopped and then restarted today. Patient reports the bleeding yesterday was just a little red and thin in her underwear. Patient reports bleeding this morning was only present when wiping and experienced some red spotting, followed by a darker, thicker, brown discharge in her underwear after that, with nothing since that time.  Passing blood clots? no Blood soaking clothes? no Lightheaded/dizzy? no Significant pelvic pain or cramping? no  Current pregnancy problems? Pt has not yet been seen Blood Type? unknown Allergies? NKDA Current medications? none Current P[facility] & next appt? [facility], 11/18/2020 for confirmation  Pt denies vaginal discharge/odor/itching. Pt denies N/V, abdominal pain, constipation, diarrhea, or urinary problems. Pt denies fever, chills, fatigue, sweating or changes in appetite. Pt denies SOB or chest pain. Pt denies dizziness, HA, light-headedness, weakness.   OB History    Gravida  2   Para      Term      Preterm      AB  1   Living        SAB      TAB  1   Ectopic      Multiple      Live Births              History reviewed. No pertinent past medical history.  History reviewed. No pertinent surgical history.  History reviewed. No pertinent family history.  Social History   Tobacco Use  . Smoking status: Never Smoker  . Smokeless tobacco: Never Used  Vaping Use  . Vaping Use: Former  Substance Use Topics  . Alcohol use: Not Currently    Comment: stopped after finding out she was pregnant  . Drug use: Not on file    Allergies: No Known Allergies  Medications Prior to Admission  Medication Sig  Dispense Refill Last Dose  . Prenatal Vit-Fe Fumarate-FA (PRENATAL MULTIVITAMIN) TABS tablet Take 1 tablet by mouth daily at 12 noon.       Review of Systems  Constitutional: Negative for chills, diaphoresis, fatigue and fever.  Eyes: Negative for visual disturbance.  Respiratory: Negative for shortness of breath.   Cardiovascular: Negative for chest pain.  Gastrointestinal: Negative for abdominal pain, constipation, diarrhea, nausea and vomiting.  Genitourinary: Positive for vaginal bleeding. Negative for dysuria, flank pain, frequency, pelvic pain, urgency and vaginal discharge.  Neurological: Negative for dizziness, weakness, light-headedness and headaches.   Physical Exam   Blood pressure 108/73, pulse 83, temperature 98.5 F (36.9 C), temperature source Oral, resp. rate 16, height 5\' 4"  (1.626 m), weight 55.4 kg, last menstrual period 09/30/2020, SpO2 99 %.  Patient Vitals for the past 24 hrs:  BP Temp Temp src Pulse Resp SpO2 Height Weight  11/09/20 1855 108/73 98.5 F (36.9 C) Oral 83 16 99 % -- --  11/09/20 1852 -- -- -- -- -- -- 5\' 4"  (1.626 m) 55.4 kg  11/09/20 1745 (!) 151/95 98.4 F (36.9 C) Oral (!) 134 16 98 % 5\' 4"  (1.626 m) 56.7 kg   Physical Exam Vitals and nursing note reviewed.  Constitutional:      General: She is not in acute distress.    Appearance:  Normal appearance. She is not ill-appearing, toxic-appearing or diaphoretic.  HENT:     Head: Normocephalic and atraumatic.  Pulmonary:     Effort: Pulmonary effort is normal.  Neurological:     Mental Status: She is alert and oriented to person, place, and time.  Psychiatric:        Mood and Affect: Mood normal.        Behavior: Behavior normal.        Thought Content: Thought content normal.        Judgment: Judgment normal.    Results for orders placed or performed during the hospital encounter of 11/09/20 (from the past 24 hour(s))  Pregnancy, urine POC     Status: Abnormal   Collection Time:  11/09/20  6:28 PM  Result Value Ref Range   Preg Test, Ur POSITIVE (A) NEGATIVE  ABO/Rh     Status: None   Collection Time: 11/09/20  6:59 PM  Result Value Ref Range   ABO/RH(D)      B POS Performed at [facility], [address] [place], [place] [address]   CBC     Status: None   Collection Time: 11/09/20  6:59 PM  Result Value Ref Range   WBC 9.5 4.0 - 10.5 K/uL   RBC 4.02 3.87 - 5.11 MIL/uL   Hemoglobin 12.5 12.0 - 15.0 g/dL   HCT 44.8 36 - 46 %   MCV 89.8 80.0 - 100.0 fL   MCH 31.1 26.0 - 34.0 pg   MCHC 34.6 30.0 - 36.0 g/dL   RDW 18.5 63.1 - 49.7 %   Platelets 287 150 - 400 K/uL   nRBC 0.0 0.0 - 0.2 %  Comprehensive metabolic panel     Status: Abnormal   Collection Time: 11/09/20  6:59 PM  Result Value Ref Range   Sodium 137 135 - 145 mmol/L   Potassium 3.8 3.5 - 5.1 mmol/L   Chloride 106 98 - 111 mmol/L   CO2 20 (L) 22 - 32 mmol/L   Glucose, Bld 105 (H) 70 - 99 mg/dL   BUN 12 6 - 20 mg/dL   Creatinine, Ser 0.26 0.44 - 1.00 mg/dL   Calcium 9.2 8.9 - 37.8 mg/dL   Total Protein 6.7 6.5 - 8.1 g/dL   Albumin 4.2 3.5 - 5.0 g/dL   AST 18 15 - 41 U/L   ALT 16 0 - 44 U/L   Alkaline Phosphatase 28 (L) 38 - 126 U/L   Total Bilirubin 0.7 0.3 - 1.2 mg/dL   GFR, Estimated >58 >85 mL/min   Anion gap 11 5 - 15  hCG, quantitative, pregnancy     Status: Abnormal   Collection Time: 11/09/20  6:59 PM  Result Value Ref Range   hCG, Beta Chain, Quant, S 41,708 (H) <5 mIU/mL  Wet prep, genital     Status: Abnormal   Collection Time: 11/09/20  8:04 PM  Result Value Ref Range   Yeast Wet Prep HPF POC NONE SEEN NONE SEEN   Trich, Wet Prep NONE SEEN NONE SEEN   Clue Cells Wet Prep HPF POC NONE SEEN NONE SEEN   WBC, Wet Prep HPF POC MANY (A) NONE SEEN   Sperm NONE SEEN    US OB LESS THAN 14 WEEKS WITH OB TRANSVAGINAL  Result Date: 11/09/2020 CLINICAL DATA:  Bleeding and passing clots EXAM: OBSTETRIC <14 WK [place] AND TRANSVAGINAL OB US TECHNIQUE: Both transabdominal and  transvaginal ultrasound examinations were performed for complete evaluation  of the gestation as well as the maternal uterus, adnexal regions, and pelvic cul-de-sac. Transvaginal technique was performed to assess early pregnancy. COMPARISON:  None. FINDINGS: Intrauterine gestational sac: Single intrauterine gestational sac Yolk sac:  Visualized Embryo:  Visualized Cardiac Activity: Visualized Heart Rate: 91 bpm CRL: 3.1 mm   5 w   6 d                  [place] EDC: 07/06/2021 Subchorionic hemorrhage:  Small subchorionic hemorrhage Maternal uterus/adnexae: Ovaries are within normal limits. The right ovary measures 2.8 x 1.9 x 1.8 cm. The left ovary measures 2.6 x 3.5 x 2 cm. No significant free fluid. IMPRESSION: 1. Single viable intrauterine pregnancy as above. Fetal bradycardia. 2. Small subchorionic hemorrhage. Electronically Signed   By: Jasmine Pang M.D.   On: 11/09/2020 21:00    MAU Course  Procedures  MDM -r/o ectopic -CBC: WNL -CMP: no abnormalities requiring treatment -[place]: single IUP, +yolk sac, +embryo, +FHR 91, [redacted]w[redacted]d, sm SCH -hCG: 60,737 -ABO: B Positive -WetPrep: WNL -GC/CT collected -pt discharged to home in stable condition  Orders Placed This Encounter  Procedures  . Wet prep, genital    Standing Status:   Standing    Number of Occurrences:   1  . US OB LESS THAN 14 WEEKS WITH OB TRANSVAGINAL    Standing Status:   Standing    Number of Occurrences:   1    Order Specific Question:   Symptom/Reason for Exam    Answer:   Vaginal bleeding affecting early pregnancy [[id number]]  . CBC    Standing Status:   Standing    Number of Occurrences:   1  . Comprehensive metabolic panel    Standing Status:   Standing    Number of Occurrences:   1  . hCG, quantitative, pregnancy    Standing Status:   Standing    Number of Occurrences:   1  . POC urine preg, ED    Standing Status:   Standing    Number of Occurrences:   1  . Pregnancy, urine POC    Standing Status:   Standing    Number of  Occurrences:   1  . ABO/Rh    Standing Status:   Standing    Number of Occurrences:   1  . Discharge patient    Order Specific Question:   Discharge disposition    Answer:   01-Home or Self Care [1]    Order Specific Question:   Discharge patient date    Answer:   11/09/2020   No orders of the defined types were placed in this encounter.   Assessment and Plan   1. Subchorionic hemorrhage of placenta in first trimester, single or unspecified fetus   2. Vaginal bleeding affecting early pregnancy   3. Blood type, Rh positive     Allergies as of 11/09/2020   No Known Allergies     Medication List    TAKE these medications   prenatal multivitamin Tabs tablet Take 1 tablet by mouth daily at 12 noon.       -will call with culture results, if positive -safe meds in pregnancy list given -f/u with OB for repeat US as planned -discussed expected s/sx of [facility] -return MAU precautions -pt discharged to home in stable condition  Joni Reining E Binnie Vonderhaar 11/09/2020, 9:25 PM

## 2020-11-09 NOTE — ED Triage Notes (Signed)
Emergency Medicine Provider OB Triage Evaluation Note  Heather Munoz is a 29 y.o. female, No obstetric history on file., at Unknown gestation who presents to the emergency department with complaints of vaginal bleeding beginning yesterday. LMP October 11. Followed by OB/GYN Zelphia [facility]. 2 positive pregnancy tests at home.    Review of  Systems  Positive: Vaginal bleeding Negative: Abdominal pain, nausea, fever  Physical Exam  BP (!) 151/95   Pulse (!) 134   Temp 98.4 F (36.9 C) (Oral)   Resp 16   Ht 5\' 4"  (1.626 m)   Wt 56.7 kg   SpO2 98%   BMI 21.46 kg/m  General: Awake, no distress  HEENT: Atraumatic  Resp: Normal effort  Cardiac: Tachycardia Abd: Nondistended, nontender  MSK: Moves all extremities without difficulty Neuro: Speech clear Psych: Anxious and tearful  Medical Decision Making  Pt evaluated for pregnancy concern and is stable for transfer to MAU. Pt is in agreement with plan for transfer.  6:25 PM Discussed with MAU APP, , who accepts patient in transfer.  Clinical Impression  No diagnosis found.   Positive POC urine pregnancy test here in the ED.      Joni Reining, PA-C 11/09/20 1829

## 2020-11-09 NOTE — ED Triage Notes (Signed)
The pt lmp was October 11th she has had 2 pod pregnancy tests today she passed some dark bloody in the toilet some abd cramping the bleeding stopped than started again  Getting  Intermittent vaginal bleeding cramps only  2nd pregnancy  Getting a [place] next week ??

## 2020-11-09 NOTE — Discharge Instructions (Signed)
Subchorionic Hematoma ° °A subchorionic hematoma is a gathering of blood between the outer wall of the embryo (chorion) and the inner wall of the womb (uterus). °This condition can cause vaginal bleeding. If they cause little or no vaginal bleeding, early small hematomas usually shrink on their own and do not affect your baby or pregnancy. When bleeding starts later in pregnancy, or if the hematoma is larger or occurs in older pregnant women, the condition may be more serious. Larger hematomas may get bigger, which increases the chances of miscarriage. This condition also increases the risk of: °· Premature separation of the placenta from the uterus. °· Premature (preterm) labor. °· Stillbirth. °What are the causes? °The exact cause of this condition is not known. It occurs when blood is trapped between the placenta and the uterine wall because the placenta has separated from the original site of implantation. °What increases the risk? °You are more likely to develop this condition if: °· You were treated with fertility medicines. °· You conceived through in vitro fertilization (IVF). °What are the signs or symptoms? °Symptoms of this condition include: °· Vaginal spotting or bleeding. °· Contractions of the uterus. These cause abdominal pain. °Sometimes you may have no symptoms and the bleeding may only be seen when ultrasound images are taken (transvaginal ultrasound). °How is this diagnosed? °This condition is diagnosed based on a physical exam. This includes a pelvic exam. You may also have other tests, including: °· Blood tests. °· Urine tests. °· Ultrasound of the abdomen. °How is this treated? °Treatment for this condition can vary. Treatment may include: °· Watchful waiting. You will be monitored closely for any changes in bleeding. During this stage: °? The hematoma may be reabsorbed by the body. °? The hematoma may separate the fluid-filled space containing the embryo (gestational sac) from the wall of the  womb (endometrium). °· Medicines. °· Activity restriction. This may be needed until the bleeding stops. °Follow these instructions at home: °· Stay on bed rest if told to do so by your health care provider. °· Do not lift anything that is heavier than 10 lbs. (4.5 kg) or as told by your health care provider. °· Do not use any products that contain nicotine or tobacco, such as cigarettes and e-cigarettes. If you need help quitting, ask your health care provider. °· Track and write down the number of pads you use each day and how soaked (saturated) they are. °· Do not use tampons. °· Keep all follow-up visits as told by your health care provider. This is important. Your health care provider may ask you to have follow-up blood tests or ultrasound tests or both. °Contact a health care provider if: °· You have any vaginal bleeding. °· You have a fever. °Get help right away if: °· You have severe cramps in your stomach, back, abdomen, or pelvis. °· You pass large clots or tissue. Save any tissue for your health care provider to look at. °· You have more vaginal bleeding, and you faint or become lightheaded or weak. °Summary °· A subchorionic hematoma is a gathering of blood between the outer wall of the placenta and the uterus. °· This condition can cause vaginal bleeding. °· Sometimes you may have no symptoms and the bleeding may only be seen when ultrasound images are taken. °· Treatment may include watchful waiting, medicines, or activity restriction. °This information is not intended to replace advice given to you by your health care provider. Make sure you discuss any questions you   have with your health care provider. Document Revised: 11/19/2017 Document Reviewed: 02/02/2017 Elsevier Patient Education  [place] of Pregnancy The first trimester of pregnancy is from week 1 until the end of week 13 (months 1 through 3). A week after a sperm fertilizes an egg, the egg will  implant on the wall of the uterus. This embryo will begin to develop into a baby. Genes from you and your partner will form the baby. The female genes will determine whether the baby will be a boy or a girl. At 6-8 weeks, the eyes and face will be formed, and the heartbeat can be seen on ultrasound. At the end of 12 weeks, all the baby's organs will be formed. Now that you are pregnant, you will want to do everything you can to have a healthy baby. Two of the most important things are to get good prenatal care and to follow your health care provider's instructions. Prenatal care is all the medical care you receive before the baby's birth. This care will help prevent, find, and treat any problems during the pregnancy and childbirth. Body changes during your first trimester Your body goes through many changes during pregnancy. The changes vary from woman to woman.  You may gain or lose a couple of pounds at first.  You may feel sick to your stomach (nauseous) and you may throw up (vomit). If the vomiting is uncontrollable, call your health care provider.  You may tire easily.  You may develop headaches that can be relieved by medicines. All medicines should be approved by your health care provider.  You may urinate more often. Painful urination may mean you have a bladder infection.  You may develop heartburn as a result of your pregnancy.  You may develop constipation because certain hormones are causing the muscles that push stool through your intestines to slow down.  You may develop hemorrhoids or swollen veins (varicose veins).  Your breasts may begin to grow larger and become tender. Your nipples may stick out more, and the tissue that surrounds them (areola) may become darker.  Your gums may bleed and may be sensitive to brushing and flossing.  Dark spots or blotches (chloasma, mask of pregnancy) may develop on your face. This will likely fade after the baby is born.  Your menstrual  periods will stop.  You may have a loss of appetite.  You may develop cravings for certain kinds of food.  You may have changes in your emotions from day to day, such as being excited to be pregnant or being concerned that something may go wrong with the pregnancy and baby.  You may have more vivid and strange dreams.  You may have changes in your hair. These can include thickening of your hair, rapid growth, and changes in texture. Some women also have hair loss during or after pregnancy, or hair that feels dry or thin. Your hair will most likely return to normal after your baby is born. What to expect at prenatal visits During a routine prenatal visit:  You will be weighed to make sure you and the baby are growing normally.  Your blood pressure will be taken.  Your abdomen will be measured to track your baby's growth.  The fetal heartbeat will be listened to between weeks 10 and 14 of your pregnancy.  Test results from any previous visits will be discussed. Your health care provider may ask you:  How you  are feeling.  If you are feeling the baby move.  If you have had any abnormal symptoms, such as leaking fluid, bleeding, severe headaches, or abdominal cramping.  If you are using any tobacco products, including cigarettes, chewing tobacco, and electronic cigarettes.  If you have any questions. Other tests that may be performed during your first trimester include:  Blood tests to find your blood type and to check for the presence of any previous infections. The tests will also be used to check for low iron levels (anemia) and protein on red blood cells (Rh antibodies). Depending on your risk factors, or if you previously had diabetes during pregnancy, you may have tests to check for high blood sugar that affects pregnant women (gestational diabetes).  Urine tests to check for infections, diabetes, or protein in the urine.  An ultrasound to confirm the proper growth and  development of the baby.  Fetal screens for spinal cord problems (spina bifida) and Down syndrome.  HIV (human immunodeficiency virus) testing. Routine prenatal testing includes screening for HIV, unless you choose not to have this test.  You may need other tests to make sure you and the baby are doing well. Follow these instructions at home: Medicines  Follow your health care provider's instructions regarding medicine use. Specific medicines may be either safe or unsafe to take during pregnancy.  Take a prenatal vitamin that contains at least 600 micrograms (mcg) of folic acid.  If you develop constipation, try taking a stool softener if your health care provider approves. Eating and drinking   Eat a balanced diet that includes fresh fruits and vegetables, whole grains, good sources of protein such as meat, eggs, or tofu, and low-fat dairy. Your health care provider will help you determine the amount of weight gain that is right for you.  Avoid raw meat and uncooked cheese. These carry germs that can cause birth defects in the baby.  Eating four or five small meals rather than three large meals a day may help relieve nausea and vomiting. If you start to feel nauseous, eating a few soda crackers can be helpful. Drinking liquids between meals, instead of during meals, also seems to help ease nausea and vomiting.  Limit foods that are high in fat and processed sugars, such as fried and sweet foods.  To prevent constipation: ? Eat foods that are high in fiber, such as fresh fruits and vegetables, whole grains, and beans. ? Drink enough fluid to keep your urine clear or pale yellow. Activity  Exercise only as directed by your health care provider. Most women can continue their usual exercise routine during pregnancy. Try to exercise for 30 minutes at least 5 days a week. Exercising will help you: ? Control your weight. ? Stay in shape. ? Be prepared for labor and  delivery.  Experiencing pain or cramping in the lower abdomen or lower back is a good sign that you should stop exercising. Check with your health care provider before continuing with normal exercises.  Try to avoid standing for long periods of time. Move your legs often if you must stand in one place for a long time.  Avoid heavy lifting.  Wear low-heeled shoes and practice good posture.  You may continue to have sex unless your health care provider tells you not to. Relieving pain and discomfort  Wear a good support bra to relieve breast tenderness.  Take warm sitz baths to soothe any pain or discomfort caused by hemorrhoids. Use hemorrhoid cream if  your health care provider approves.  Rest with your legs elevated if you have leg cramps or low back pain.  If you develop varicose veins in your legs, wear support hose. Elevate your feet for 15 minutes, 3-4 times a day. Limit salt in your diet. Prenatal care  Schedule your prenatal visits by the twelfth week of pregnancy. They are usually scheduled monthly at first, then more often in the last 2 months before delivery.  Write down your questions. Take them to your prenatal visits.  Keep all your prenatal visits as told by your health care provider. This is important. Safety  Wear your seat belt at all times when driving.  Make a list of emergency phone numbers, including numbers for family, friends, the hospital, and police and fire departments. General instructions  Ask your health care provider for a referral to a local prenatal education class. Begin classes no later than the beginning of month 6 of your pregnancy.  Ask for help if you have counseling or nutritional needs during pregnancy. Your health care provider can offer advice or refer you to specialists for help with various needs.  Do not use hot tubs, steam rooms, or saunas.  Do not douche or use tampons or scented sanitary pads.  Do not cross your legs for long  periods of time.  Avoid cat litter boxes and soil used by cats. These carry germs that can cause birth defects in the baby and possibly loss of the fetus by miscarriage or stillbirth.  Avoid all smoking, herbs, alcohol, and medicines not prescribed by your health care provider. Chemicals in these products affect the formation and growth of the baby.  Do not use any products that contain nicotine or tobacco, such as cigarettes and e-cigarettes. If you need help quitting, ask your health care provider. You may receive counseling support and other resources to help you quit.  Schedule a dentist appointment. At home, brush your teeth with a soft toothbrush and be gentle when you floss. Contact a health care provider if:  You have dizziness.  You have mild pelvic cramps, pelvic pressure, or nagging pain in the abdominal area.  You have persistent nausea, vomiting, or diarrhea.  You have a bad smelling vaginal discharge.  You have pain when you urinate.  You notice increased swelling in your face, hands, legs, or ankles.  You are exposed to fifth disease or chickenpox.  You are exposed to [place] measles (rubella) and have never had it. Get help right away if:  You have a fever.  You are leaking fluid from your vagina.  You have spotting or bleeding from your vagina.  You have severe abdominal cramping or pain.  You have rapid weight gain or loss.  You vomit blood or material that looks like coffee grounds.  You develop a severe headache.  You have shortness of breath.  You have any kind of trauma, such as from a fall or a car accident. Summary  The first trimester of pregnancy is from week 1 until the end of week 13 (months 1 through 3).  Your body goes through many changes during pregnancy. The changes vary from woman to woman.  You will have routine prenatal visits. During those visits, your health care provider will examine you, discuss any test results you may have,  and talk with you about how you are feeling. This information is not intended to replace advice given to you by your health care provider. Make sure you discuss any  questions you have with your health care provider. Document Revised: 11/19/2017 Document Reviewed: 11/18/2016 Elsevier Patient Education  2020 [employer].                        Safe Medications in Pregnancy    Acne: Benzoyl Peroxide Salicylic Acid  Backache/Headache: Tylenol: 2 regular strength every 4 hours OR              2 Extra strength every 6 hours  Colds/Coughs/Allergies: Benadryl (alcohol free) 25 mg every 6 hours as needed Breath right strips Claritin Cepacol throat lozenges Chloraseptic throat spray Cold-Eeze- up to three times per day Cough drops, alcohol free Flonase (by prescription only) Guaifenesin Mucinex Robitussin DM (plain only, alcohol free) Saline nasal spray/drops Sudafed (pseudoephedrine) & Actifed ** use only after [redacted] weeks gestation and if you do not have high blood pressure Tylenol Vicks Vaporub Zinc lozenges Zyrtec   Constipation: Colace Ducolax suppositories Fleet enema Glycerin suppositories Metamucil Milk of magnesia Miralax Senokot Smooth move tea  Diarrhea: Kaopectate Imodium A-D  *NO pepto Bismol  Hemorrhoids: Anusol Anusol HC Preparation H Tucks  Indigestion: Tums Maalox Mylanta Zantac  Pepcid  Insomnia: Benadryl (alcohol free) 25mg  every 6 hours as needed Tylenol PM Unisom, no Gelcaps  Leg Cramps: Tums MagGel  Nausea/Vomiting:  Bonine Dramamine Emetrol Ginger extract Sea bands Meclizine  Nausea medication to take during pregnancy:  Unisom (doxylamine succinate 25 mg tablets) Take one tablet daily at bedtime. If symptoms are not adequately controlled, the dose can be increased to a maximum recommended dose of two tablets daily (1/2 tablet in the morning, 1/2 tablet mid-afternoon and one at bedtime). Vitamin B6 100mg  tablets. Take  one tablet twice a day (up to 200 mg per day).  Skin Rashes: Aveeno products Benadryl cream or 25mg  every 6 hours as needed Calamine Lotion 1% cortisone cream  Yeast infection: Gyne-lotrimin 7 Monistat 7   **If taking multiple medications, please check labels to avoid duplicating the same active ingredients **take medication as directed on the label ** Do not exceed 4000 mg of tylenol in 24 hours **Do not take medications that contain aspirin or ibuprofen

## 2020-11-11 ENCOUNTER — Encounter: Payer: Self-pay | Admitting: Physician Assistant

## 2020-11-11 LAB — ABO/RH: ABO/RH(D): B POS

## 2020-11-11 LAB — GC/CHLAMYDIA PROBE AMP (~~LOC~~) NOT AT ARMC
Chlamydia: NEGATIVE
Comment: NEGATIVE
Comment: NORMAL
Neisseria Gonorrhea: NEGATIVE

## 2020-11-26 LAB — OB RESULTS CONSOLE HIV ANTIBODY (ROUTINE TESTING): HIV: NONREACTIVE

## 2020-11-26 LAB — OB RESULTS CONSOLE HEPATITIS B SURFACE ANTIGEN: Hepatitis B Surface Ag: NEGATIVE

## 2020-11-26 LAB — OB RESULTS CONSOLE ABO/RH: RH Type: POSITIVE

## 2020-11-26 LAB — OB RESULTS CONSOLE GC/CHLAMYDIA
Chlamydia: NEGATIVE
Gonorrhea: NEGATIVE

## 2020-11-26 LAB — OB RESULTS CONSOLE RPR: RPR: NONREACTIVE

## 2020-11-26 LAB — OB RESULTS CONSOLE RUBELLA ANTIBODY, IGM: Rubella: IMMUNE

## 2020-11-26 LAB — OB RESULTS CONSOLE ANTIBODY SCREEN: Antibody Screen: NEGATIVE

## 2020-12-02 LAB — OB RESULTS CONSOLE HIV ANTIBODY (ROUTINE TESTING): HIV: NONREACTIVE

## 2020-12-02 LAB — OB RESULTS CONSOLE HEPATITIS B SURFACE ANTIGEN: Hepatitis B Surface Ag: NEGATIVE

## 2020-12-02 LAB — OB RESULTS CONSOLE RUBELLA ANTIBODY, IGM: Rubella: IMMUNE

## 2020-12-10 LAB — OB RESULTS CONSOLE GC/CHLAMYDIA
Chlamydia: NEGATIVE
Gonorrhea: NEGATIVE

## 2021-04-03 IMAGING — US US OB < 14 WEEKS - US OB TV
1 series · 15 of 28 positions shown · non-contrast
Comparison: None.

CLINICAL DATA: Bleeding and passing clots

EXAM:
OBSTETRIC <14 WK US AND TRANSVAGINAL OB US
TECHNIQUE: Both transabdominal and transvaginal ultrasound examinations were
performed for complete evaluation of the gestation as well as the
maternal uterus, adnexal regions, and pelvic cul-de-sac.
Transvaginal technique was performed to assess early pregnancy.

[Series 1: us ob < 14 weeks - us ob tv · 37 acquisitions, 15 frames shown]
[im 1/37]
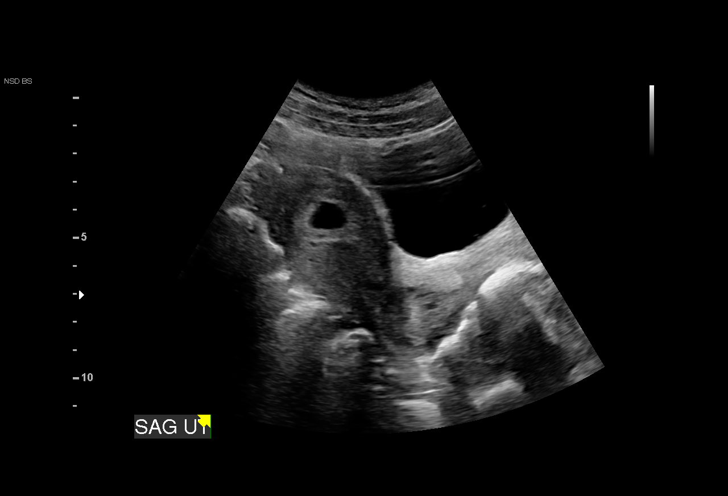
[im 3/37]
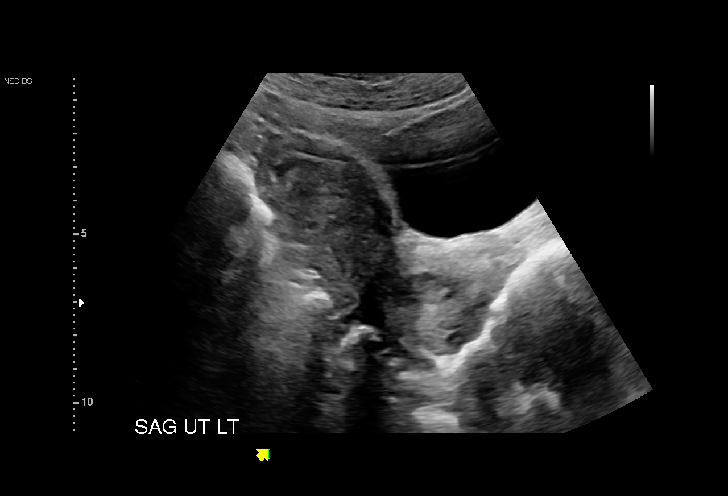
[im 6/37]
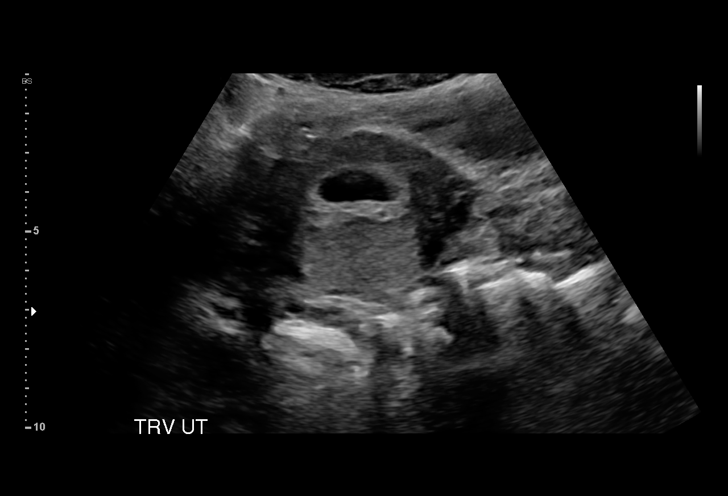
[im 9/37]
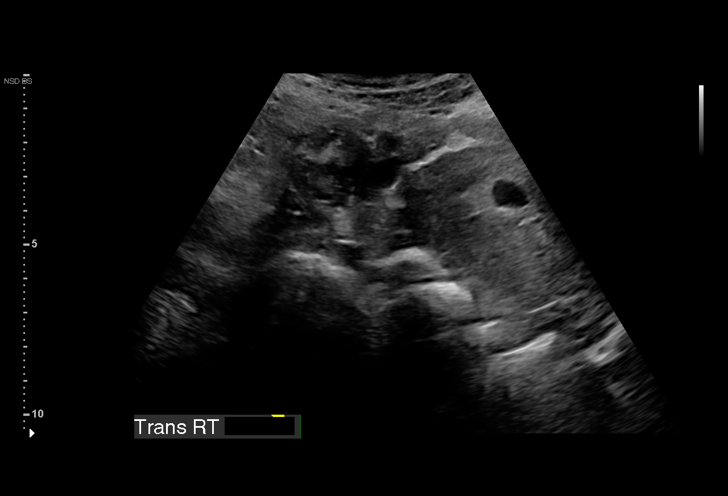
[im 11/37]
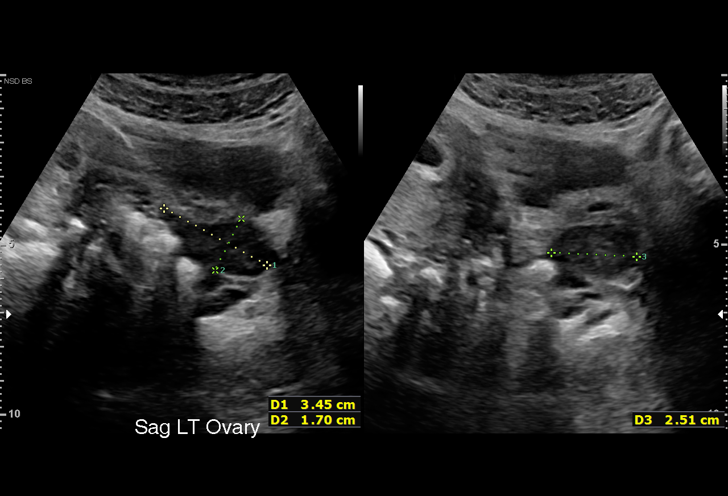
[im 14/37]
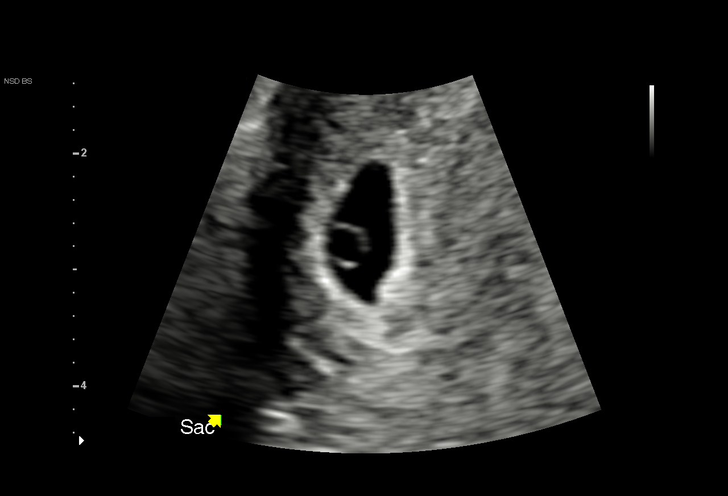
[im 17/37]
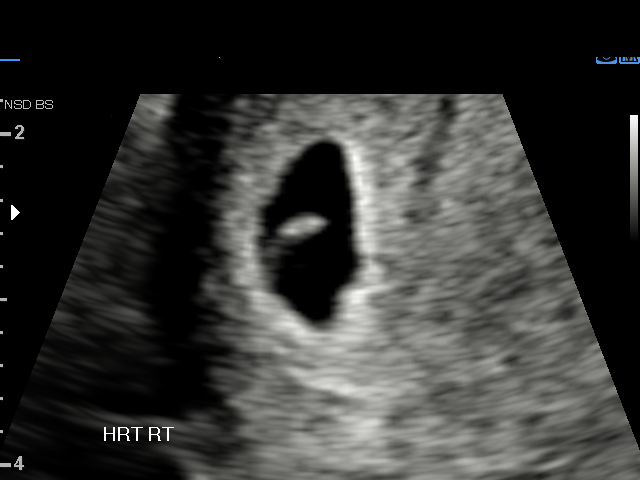
[im 19/37]
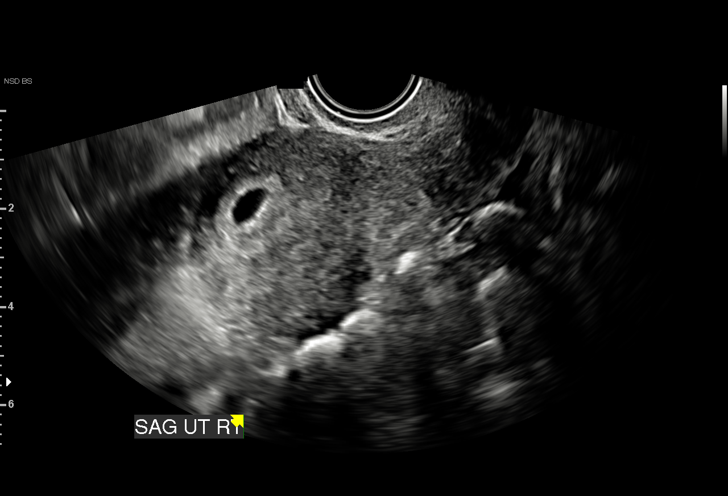
[im 21/37]
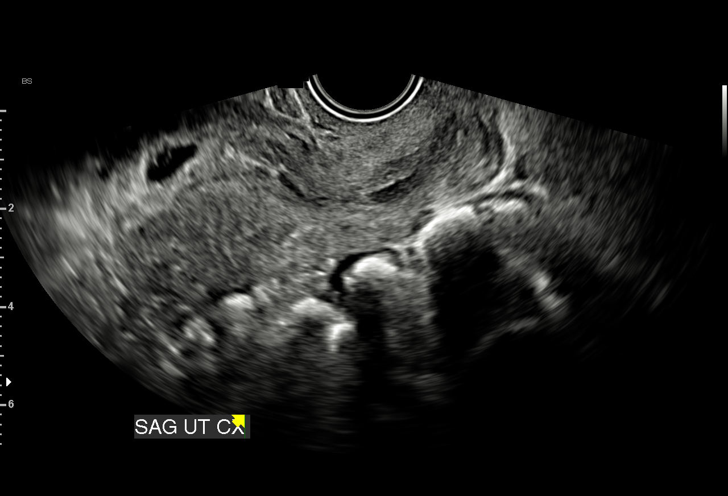
[im 23/37]
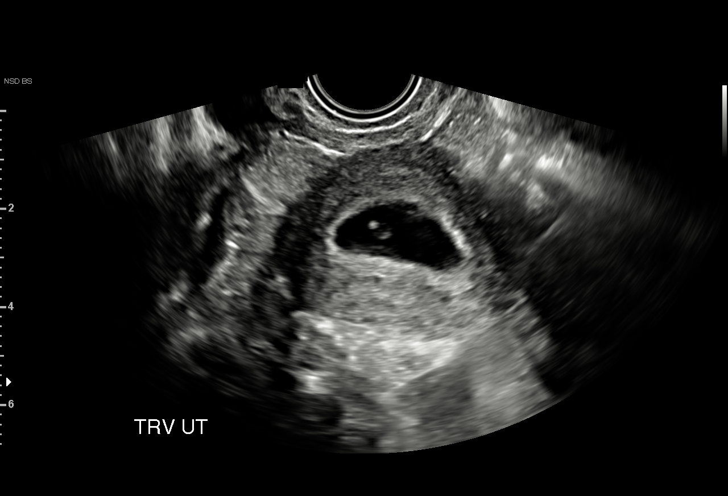
[im 26/37]
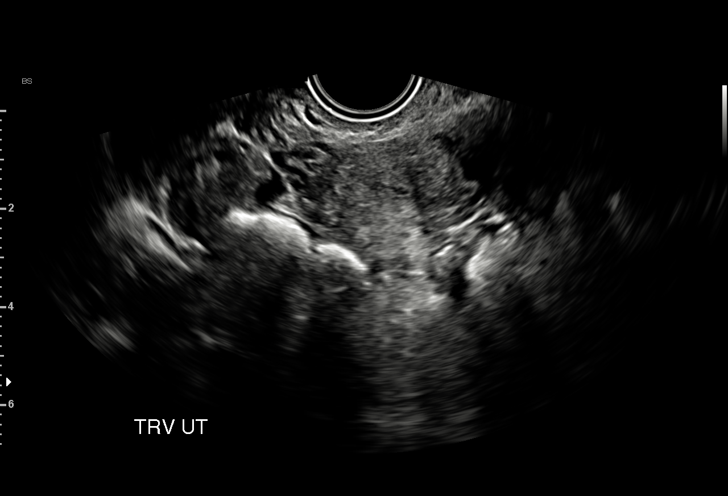
[im 29/37]
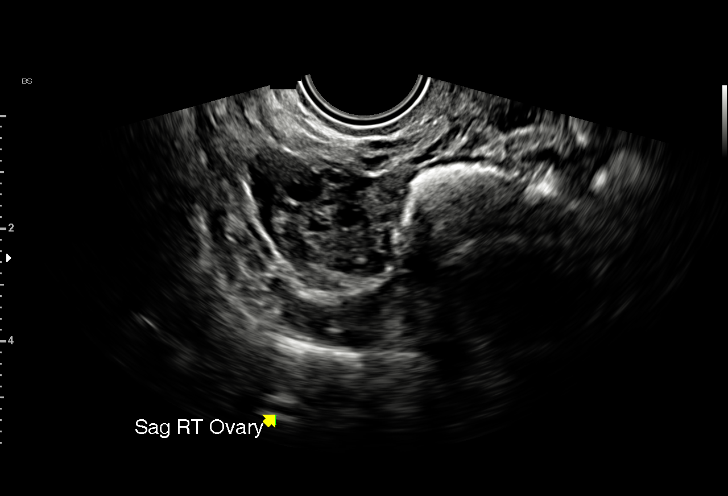
[im 31/37]
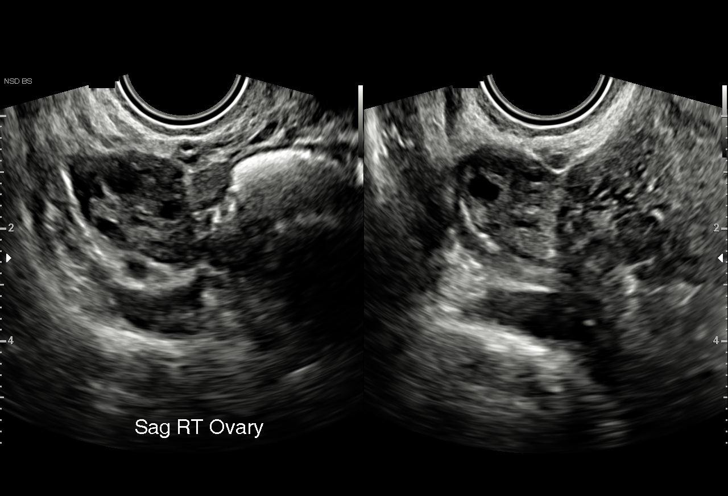
[im 34/37]
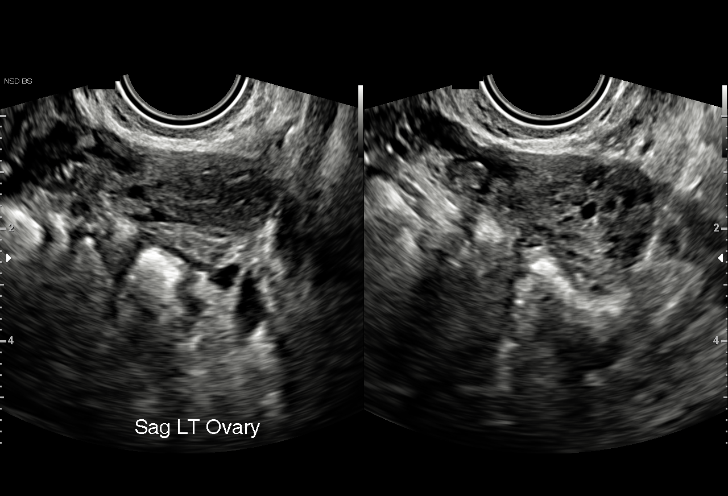
[im 37/37]
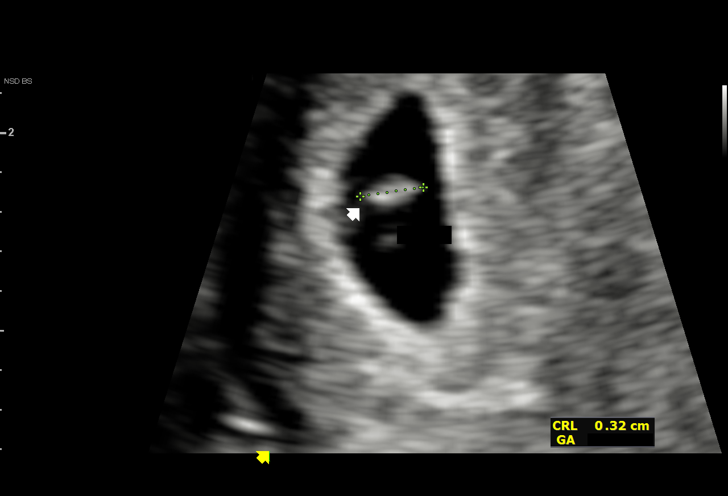

[15 of 28 positions shown; findings below may reference images not displayed]

FINDINGS: Intrauterine gestational sac: Single intrauterine gestational sac

Yolk sac:  Visualized

Embryo:  Visualized

Cardiac Activity: Visualized

Heart Rate: 91 bpm

CRL: 3.1 mm   5 w   6 d                  US EDC: 07/06/2021

Subchorionic hemorrhage:  Small subchorionic hemorrhage

Maternal uterus/adnexae: Ovaries are within normal limits. The right
ovary measures 2.8 x 1.9 x 1.8 cm. The left ovary measures 2.6 x
x 2 cm. No significant free fluid.
IMPRESSION: 1. Single viable intrauterine pregnancy as above. Fetal bradycardia.
2. Small subchorionic hemorrhage.

## 2021-06-13 LAB — OB RESULTS CONSOLE GBS: GBS: NEGATIVE

## 2021-07-04 ENCOUNTER — Encounter (HOSPITAL_COMMUNITY): Payer: Self-pay | Admitting: *Deleted

## 2021-07-04 ENCOUNTER — Telehealth (HOSPITAL_COMMUNITY): Payer: Self-pay | Admitting: *Deleted

## 2021-07-04 NOTE — Telephone Encounter (Signed)
Preadmission screen  

## 2021-07-07 ENCOUNTER — Other Ambulatory Visit (HOSPITAL_COMMUNITY)
Admission: RE | Admit: 2021-07-07 | Discharge: 2021-07-07 | Disposition: A | Discharge status: Home / Self Care | Payer: Managed Care, Other (non HMO) | Source: Ambulatory Visit | Attending: Obstetrics & Gynecology | Admitting: Obstetrics & Gynecology

## 2021-07-07 DIAGNOSIS — Z20822 Contact with and (suspected) exposure to covid-19: Secondary | ICD-10-CM | POA: Insufficient documentation

## 2021-07-07 DIAGNOSIS — Z01812 Encounter for preprocedural laboratory examination: Secondary | ICD-10-CM | POA: Insufficient documentation

## 2021-07-07 LAB — SARS CORONAVIRUS 2 (TAT 6-24 HRS): SARS Coronavirus 2: NEGATIVE

## 2021-07-09 ENCOUNTER — Inpatient Hospital Stay (HOSPITAL_COMMUNITY)
Admission: AD | Admit: 2021-07-09 | Discharge: 2021-07-12 | DRG: 788 | Disposition: A | Discharge status: Home / Self Care | Payer: Managed Care, Other (non HMO) | Attending: Obstetrics & Gynecology | Admitting: Obstetrics & Gynecology

## 2021-07-09 ENCOUNTER — Encounter (HOSPITAL_COMMUNITY): Payer: Self-pay | Admitting: Obstetrics & Gynecology

## 2021-07-09 ENCOUNTER — Other Ambulatory Visit: Payer: Self-pay

## 2021-07-09 ENCOUNTER — Inpatient Hospital Stay (HOSPITAL_COMMUNITY): Payer: Managed Care, Other (non HMO)

## 2021-07-09 DIAGNOSIS — Z20822 Contact with and (suspected) exposure to covid-19: Secondary | ICD-10-CM | POA: Diagnosis present

## 2021-07-09 DIAGNOSIS — Z3A40 40 weeks gestation of pregnancy: Secondary | ICD-10-CM | POA: Diagnosis not present

## 2021-07-09 DIAGNOSIS — O480 Post-term pregnancy: Secondary | ICD-10-CM | POA: Diagnosis present

## 2021-07-09 DIAGNOSIS — Z98891 History of uterine scar from previous surgery: Secondary | ICD-10-CM

## 2021-07-09 DIAGNOSIS — Z3490 Encounter for supervision of normal pregnancy, unspecified, unspecified trimester: Secondary | ICD-10-CM

## 2021-07-09 LAB — CBC
HCT: 34 % — ABNORMAL LOW (ref 36.0–46.0)
Hemoglobin: 11.4 g/dL — ABNORMAL LOW (ref 12.0–15.0)
MCH: 30.5 pg (ref 26.0–34.0)
MCHC: 33.5 g/dL (ref 30.0–36.0)
MCV: 90.9 fL (ref 80.0–100.0)
Platelets: 194 10*3/uL (ref 150–400)
RBC: 3.74 MIL/uL — ABNORMAL LOW (ref 3.87–5.11)
RDW: 12.7 % (ref 11.5–15.5)
WBC: 12.2 10*3/uL — ABNORMAL HIGH (ref 4.0–10.5)
nRBC: 0 % (ref 0.0–0.2)

## 2021-07-09 LAB — TYPE AND SCREEN
ABO/RH(D): B POS
Antibody Screen: NEGATIVE

## 2021-07-09 MED ORDER — ACETAMINOPHEN 325 MG PO TABS: 650.0000 mg | ORAL_TABLET | ORAL | Status: DC | PRN

## 2021-07-09 MED ORDER — OXYCODONE-ACETAMINOPHEN 5-325 MG PO TABS: 1.0000 | ORAL_TABLET | ORAL | Status: DC | PRN

## 2021-07-09 MED ORDER — OXYTOCIN-SODIUM CHLORIDE 30-0.9 UT/500ML-% IV SOLN: 2.5000 [IU]/h | INTRAVENOUS | Status: DC

## 2021-07-09 MED ORDER — PHENYLEPHRINE 40 MCG/ML (10ML) SYRINGE FOR IV PUSH (FOR BLOOD PRESSURE SUPPORT)
80.0000 ug | PREFILLED_SYRINGE | INTRAVENOUS | Status: AC | PRN
  Administered 2021-07-10 (×3): 80 ug via INTRAVENOUS

## 2021-07-09 MED ORDER — LIDOCAINE HCL (PF) 1 % IJ SOLN: 30.0000 mL | INTRAMUSCULAR | Status: DC | PRN

## 2021-07-09 MED ORDER — OXYCODONE-ACETAMINOPHEN 5-325 MG PO TABS: 2.0000 | ORAL_TABLET | ORAL | Status: DC | PRN

## 2021-07-09 MED ORDER — EPHEDRINE 5 MG/ML INJ: 10.0000 mg | INTRAVENOUS | Status: DC | PRN

## 2021-07-09 MED ORDER — ONDANSETRON HCL 4 MG/2ML IJ SOLN: 4.0000 mg | Freq: Four times a day (QID) | INTRAMUSCULAR | Status: DC | PRN

## 2021-07-09 MED ORDER — DIPHENHYDRAMINE HCL 50 MG/ML IJ SOLN: 12.5000 mg | INTRAMUSCULAR | Status: DC | PRN

## 2021-07-09 MED ORDER — LACTATED RINGERS IV SOLN: 500.0000 mL | Freq: Once | INTRAVENOUS | Status: DC

## 2021-07-09 MED ORDER — PHENYLEPHRINE 40 MCG/ML (10ML) SYRINGE FOR IV PUSH (FOR BLOOD PRESSURE SUPPORT)
80.0000 ug | PREFILLED_SYRINGE | INTRAVENOUS | Status: DC | PRN
  Filled 2021-07-09: qty 10

## 2021-07-09 MED ORDER — MISOPROSTOL 50MCG HALF TABLET
50.0000 ug | ORAL_TABLET | ORAL | Status: DC
  Administered 2021-07-09: 50 ug via ORAL
  Filled 2021-07-09: qty 1

## 2021-07-09 MED ORDER — SOD CITRATE-CITRIC ACID 500-334 MG/5ML PO SOLN
30.0000 mL | ORAL | Status: DC | PRN
  Administered 2021-07-10: 30 mL via ORAL
  Filled 2021-07-09: qty 30

## 2021-07-09 MED ORDER — LACTATED RINGERS IV SOLN
500.0000 mL | INTRAVENOUS | Status: DC | PRN
  Administered 2021-07-10: 500 mL via INTRAVENOUS

## 2021-07-09 MED ORDER — FENTANYL-BUPIVACAINE-NACL 0.5-0.125-0.9 MG/250ML-% EP SOLN
12.0000 mL/h | EPIDURAL | Status: DC | PRN
  Administered 2021-07-10: 12 mL/h via EPIDURAL
  Filled 2021-07-09: qty 250

## 2021-07-09 MED ORDER — FENTANYL CITRATE (PF) 100 MCG/2ML IJ SOLN
50.0000 ug | INTRAMUSCULAR | Status: DC | PRN
  Administered 2021-07-09: 50 ug via INTRAVENOUS
  Administered 2021-07-09: 100 ug via INTRAVENOUS
  Filled 2021-07-09 (×2): qty 2

## 2021-07-09 MED ORDER — TERBUTALINE SULFATE 1 MG/ML IJ SOLN
0.2500 mg | Freq: Once | INTRAMUSCULAR | Status: AC | PRN
  Administered 2021-07-10: 0.25 mg via SUBCUTANEOUS
  Filled 2021-07-09: qty 1

## 2021-07-09 MED ORDER — OXYTOCIN BOLUS FROM INFUSION: 333.0000 mL | Freq: Once | INTRAVENOUS | Status: DC

## 2021-07-09 MED ORDER — OXYTOCIN-SODIUM CHLORIDE 30-0.9 UT/500ML-% IV SOLN
1.0000 m[IU]/min | INTRAVENOUS | Status: DC
  Administered 2021-07-09: 2 m[IU]/min via INTRAVENOUS
  Filled 2021-07-09: qty 500

## 2021-07-09 MED ORDER — LACTATED RINGERS IV SOLN: INTRAVENOUS | Status: DC

## 2021-07-09 NOTE — Progress Notes (Signed)
Heather Munoz is a 30 y.o. G2P0010 at [redacted]w[redacted]d by ultrasound admitted for induction of labor due to Post dates. Due date 07/07/21.  Subjective: No c/o.  Feeling mild CTX  Objective: BP (!) 95/58   Pulse 77   Resp 18   Ht 5\' 4"  (1.626 m)   Wt 77.4 kg   LMP 09/30/2020   SpO2 100%   Breastfeeding Unknown   BMI 29.28 kg/m  No intake/output data recorded. No intake/output data recorded.  FHT:  FHR: 120 bpm, variability: moderate,  accelerations:  Present,  decelerations:  Absent UC:   irregular, every 2-3 minutes SVE:   Dilation: 3 Effacement (%): 50 Station: -2 Exam by:: Kanon Colunga, MD AROM, clear  Labs: Lab Results  Component Value Date   WBC 12.2 (H) 07/09/2021   HGB 11.4 (L) 07/09/2021   HCT 34.0 (L) 07/09/2021   MCV 90.9 07/09/2021   PLT 194 07/09/2021    Assessment / Plan: Elective IOL; progressing well  Labor: Progressing normally and plan to start pitocin prn Preeclampsia:   n/a Fetal Wellbeing:  Category I Pain Control:  Labor support without medications I/D:  n/a Anticipated MOD:  NSVD  07/11/2021 07/09/2021, 7:53 PM

## 2021-07-09 NOTE — H&P (Signed)
Heather Munoz is a 30 y.o. female G2P0010 at [redacted]w[redacted]d presenting for elective IOL.  Antepartum course uncomplicated. GBS negative.   OB History     Gravida  2   Para  0   Term  0   Preterm  0   AB  1   Living         SAB  0   IAB  1   Ectopic  0   Multiple      Live Births             Past Medical History:  Diagnosis Date   ADHD (attention deficit hyperactivity disorder)    History reviewed. No pertinent surgical history. Family History: family history includes ADD / ADHD in her father, sister, and sister; Healthy in her father, maternal grandfather, maternal grandmother, mother, paternal grandfather, paternal grandmother, sister, and sister. Social History:  reports that she has never smoked. She has never used smokeless tobacco. She reports previous alcohol use. She reports that she does not use drugs.     Maternal Diabetes: No Genetic Screening: Normal Maternal Ultrasounds/Referrals: Normal Fetal Ultrasounds or other Referrals:  None Maternal Substance Abuse:  No Significant Maternal Medications:  None Significant Maternal Lab Results:  Group B Strep negative Other Comments:  None  Review of Systems Maternal Medical History:  Fetal activity: Perceived fetal activity is normal.   Last perceived fetal movement was within the past hour.   Prenatal complications: no prenatal complications Prenatal Complications - Diabetes: none.  Dilation: 2 Effacement (%): 60 Station: -3 Exam by:: H.Price, RN Blood pressure (!) 95/58, pulse 77, resp. rate 18, height 5\' 4"  (1.626 m), weight 77.4 kg, last menstrual period 09/30/2020, SpO2 100 %, unknown if currently breastfeeding. Maternal Exam:  Uterine Assessment: Contraction strength is mild.  Contraction frequency is rare.  Abdomen: Patient reports no abdominal tenderness. Fundal height is c/w dates.   Estimated fetal weight is 7#8.     Fetal Exam Fetal Monitor Review: Baseline rate: 135.  Variability: moderate  (6-25 bpm).   Pattern: accelerations present and no decelerations.   Fetal State Assessment: Category I - tracings are normal.  Physical Exam Constitutional:      Appearance: Normal appearance.  HENT:     Head: Normocephalic and atraumatic.  Pulmonary:     Effort: Pulmonary effort is normal.  Abdominal:     Palpations: Abdomen is soft.  Musculoskeletal:        General: Normal range of motion.     Cervical back: Normal range of motion.  Skin:    General: Skin is warm and dry.  Neurological:     Mental Status: She is alert and oriented to person, place, and time.  Psychiatric:        Mood and Affect: Mood normal.        Behavior: Behavior normal.    Prenatal labs: ABO, Rh: --/--/B POS (07/20 1408) Antibody: NEG (07/20 1408) Rubella: Immune (12/07 0000) RPR: Nonreactive (12/07 0000)  HBsAg: Negative (12/07 0000)  HIV: Non-reactive (12/07 0000)  GBS:   Negative  Assessment/Plan: 30yo G2P0010 at [redacted]w[redacted]d for elective IOL -s/p VMP #1 at 1500; reassess at 1900. -CLEA if desired -Anticipate NSVD   [redacted]w[redacted]d 07/09/2021, 6:24 PM

## 2021-07-10 ENCOUNTER — Encounter (HOSPITAL_COMMUNITY)
Admission: AD | Disposition: A | Discharge status: Home / Self Care | Payer: Self-pay | Source: Home / Self Care | Attending: Obstetrics & Gynecology

## 2021-07-10 ENCOUNTER — Inpatient Hospital Stay (HOSPITAL_COMMUNITY): Payer: Managed Care, Other (non HMO) | Admitting: Anesthesiology

## 2021-07-10 ENCOUNTER — Encounter (HOSPITAL_COMMUNITY): Payer: Self-pay | Admitting: Obstetrics & Gynecology

## 2021-07-10 DIAGNOSIS — Z98891 History of uterine scar from previous surgery: Secondary | ICD-10-CM

## 2021-07-10 LAB — CBC
HCT: 28.8 % — ABNORMAL LOW (ref 36.0–46.0)
Hemoglobin: 10 g/dL — ABNORMAL LOW (ref 12.0–15.0)
MCH: 31.3 pg (ref 26.0–34.0)
MCHC: 34.7 g/dL (ref 30.0–36.0)
MCV: 90 fL (ref 80.0–100.0)
Platelets: 147 10*3/uL — ABNORMAL LOW (ref 150–400)
RBC: 3.2 MIL/uL — ABNORMAL LOW (ref 3.87–5.11)
RDW: 12.8 % (ref 11.5–15.5)
WBC: 17.3 10*3/uL — ABNORMAL HIGH (ref 4.0–10.5)
nRBC: 0 % (ref 0.0–0.2)

## 2021-07-10 LAB — RPR: RPR Ser Ql: NONREACTIVE

## 2021-07-10 SURGERY — Surgical Case
Anesthesia: Epidural

## 2021-07-10 MED ORDER — KETOROLAC TROMETHAMINE 30 MG/ML IJ SOLN
30.0000 mg | Freq: Four times a day (QID) | INTRAMUSCULAR | Status: AC | PRN
Start: 2021-07-10 — End: 2021-07-11

## 2021-07-10 MED ORDER — ALBUMIN HUMAN 5 % IV SOLN: INTRAVENOUS | Status: DC | PRN

## 2021-07-10 MED ORDER — SIMETHICONE 80 MG PO CHEW
80.0000 mg | CHEWABLE_TABLET | Freq: Three times a day (TID) | ORAL | Status: DC
  Administered 2021-07-10 – 2021-07-12 (×5): 80 mg via ORAL
  Filled 2021-07-10 (×6): qty 1

## 2021-07-10 MED ORDER — ZOLPIDEM TARTRATE 5 MG PO TABS: 5.0000 mg | ORAL_TABLET | Freq: Every evening | ORAL | Status: DC | PRN

## 2021-07-10 MED ORDER — LIDOCAINE HCL (PF) 1 % IJ SOLN
INTRAMUSCULAR | Status: DC | PRN
  Administered 2021-07-10 (×2): 4 mL via EPIDURAL

## 2021-07-10 MED ORDER — TETANUS-DIPHTH-ACELL PERTUSSIS 5-2.5-18.5 LF-MCG/0.5 IM SUSY: 0.5000 mL | PREFILLED_SYRINGE | Freq: Once | INTRAMUSCULAR | Status: DC

## 2021-07-10 MED ORDER — OXYTOCIN-SODIUM CHLORIDE 30-0.9 UT/500ML-% IV SOLN
INTRAVENOUS | Status: DC | PRN
  Administered 2021-07-10: 30 [IU] via INTRAVENOUS

## 2021-07-10 MED ORDER — COCONUT OIL OIL: 1.0000 "application " | TOPICAL_OIL | Status: DC | PRN

## 2021-07-10 MED ORDER — ACETAMINOPHEN 500 MG PO TABS
1000.0000 mg | ORAL_TABLET | Freq: Four times a day (QID) | ORAL | Status: DC
  Administered 2021-07-10 – 2021-07-12 (×8): 1000 mg via ORAL
  Filled 2021-07-10 (×8): qty 2

## 2021-07-10 MED ORDER — SODIUM CHLORIDE 0.9% FLUSH: 3.0000 mL | INTRAVENOUS | Status: DC | PRN

## 2021-07-10 MED ORDER — DIPHENHYDRAMINE HCL 50 MG/ML IJ SOLN: 12.5000 mg | INTRAMUSCULAR | Status: DC | PRN

## 2021-07-10 MED ORDER — LACTATED RINGERS IV SOLN: INTRAVENOUS | Status: DC | PRN

## 2021-07-10 MED ORDER — PHENYLEPHRINE 40 MCG/ML (10ML) SYRINGE FOR IV PUSH (FOR BLOOD PRESSURE SUPPORT)
PREFILLED_SYRINGE | INTRAVENOUS | Status: DC | PRN
  Administered 2021-07-10 (×3): 80 ug via INTRAVENOUS

## 2021-07-10 MED ORDER — DEXAMETHASONE SODIUM PHOSPHATE 10 MG/ML IJ SOLN
INTRAMUSCULAR | Status: AC
  Filled 2021-07-10: qty 1

## 2021-07-10 MED ORDER — NALBUPHINE HCL 10 MG/ML IJ SOLN: 5.0000 mg | Freq: Once | INTRAMUSCULAR | Status: DC | PRN

## 2021-07-10 MED ORDER — FENTANYL CITRATE (PF) 100 MCG/2ML IJ SOLN
INTRAMUSCULAR | Status: DC | PRN
  Administered 2021-07-10: 100 ug via EPIDURAL

## 2021-07-10 MED ORDER — SODIUM BICARBONATE 8.4 % IV SOLN
INTRAVENOUS | Status: AC
  Filled 2021-07-10: qty 50

## 2021-07-10 MED ORDER — IBUPROFEN 800 MG PO TABS
800.0000 mg | ORAL_TABLET | Freq: Four times a day (QID) | ORAL | Status: DC
  Administered 2021-07-10 – 2021-07-12 (×9): 800 mg via ORAL
  Filled 2021-07-10 (×9): qty 1

## 2021-07-10 MED ORDER — CEFAZOLIN SODIUM-DEXTROSE 2-4 GM/100ML-% IV SOLN
INTRAVENOUS | Status: AC
  Filled 2021-07-10: qty 100

## 2021-07-10 MED ORDER — MEPERIDINE HCL 25 MG/ML IJ SOLN
INTRAMUSCULAR | Status: AC
  Filled 2021-07-10: qty 1

## 2021-07-10 MED ORDER — MENTHOL 3 MG MT LOZG: 1.0000 | LOZENGE | OROMUCOSAL | Status: DC | PRN

## 2021-07-10 MED ORDER — DIPHENHYDRAMINE HCL 25 MG PO CAPS: 25.0000 mg | ORAL_CAPSULE | ORAL | Status: DC | PRN

## 2021-07-10 MED ORDER — MORPHINE SULFATE (PF) 0.5 MG/ML IJ SOLN
INTRAMUSCULAR | Status: AC
  Filled 2021-07-10: qty 10

## 2021-07-10 MED ORDER — ONDANSETRON HCL 4 MG/2ML IJ SOLN
INTRAMUSCULAR | Status: DC | PRN
  Administered 2021-07-10: 4 mg via INTRAVENOUS

## 2021-07-10 MED ORDER — SODIUM CHLORIDE 0.9 % IR SOLN
Status: DC | PRN
  Administered 2021-07-10 (×2): 1

## 2021-07-10 MED ORDER — MORPHINE SULFATE (PF) 0.5 MG/ML IJ SOLN
INTRAMUSCULAR | Status: DC | PRN
  Administered 2021-07-10: 2 mg via INTRAVENOUS

## 2021-07-10 MED ORDER — DIBUCAINE (PERIANAL) 1 % EX OINT: 1.0000 "application " | TOPICAL_OINTMENT | CUTANEOUS | Status: DC | PRN

## 2021-07-10 MED ORDER — ONDANSETRON HCL 4 MG/2ML IJ SOLN: 4.0000 mg | Freq: Three times a day (TID) | INTRAMUSCULAR | Status: DC | PRN

## 2021-07-10 MED ORDER — DIPHENHYDRAMINE HCL 25 MG PO CAPS: 25.0000 mg | ORAL_CAPSULE | Freq: Four times a day (QID) | ORAL | Status: DC | PRN

## 2021-07-10 MED ORDER — OXYCODONE HCL 5 MG PO TABS: 5.0000 mg | ORAL_TABLET | ORAL | Status: DC | PRN

## 2021-07-10 MED ORDER — ONDANSETRON HCL 4 MG/2ML IJ SOLN
INTRAMUSCULAR | Status: AC
  Filled 2021-07-10: qty 2

## 2021-07-10 MED ORDER — LIDOCAINE-EPINEPHRINE (PF) 2 %-1:200000 IJ SOLN
INTRAMUSCULAR | Status: DC | PRN
  Administered 2021-07-10 (×4): 5 mL via EPIDURAL

## 2021-07-10 MED ORDER — SCOPOLAMINE 1 MG/3DAYS TD PT72
MEDICATED_PATCH | TRANSDERMAL | Status: DC | PRN
  Administered 2021-07-10: 1 via TRANSDERMAL

## 2021-07-10 MED ORDER — OXYTOCIN-SODIUM CHLORIDE 30-0.9 UT/500ML-% IV SOLN: 2.5000 [IU]/h | INTRAVENOUS | Status: AC

## 2021-07-10 MED ORDER — NALBUPHINE HCL 10 MG/ML IJ SOLN
5.0000 mg | INTRAMUSCULAR | Status: DC | PRN
Start: 2021-07-10 — End: 2021-07-12

## 2021-07-10 MED ORDER — FENTANYL CITRATE (PF) 100 MCG/2ML IJ SOLN
INTRAMUSCULAR | Status: AC
  Filled 2021-07-10: qty 2

## 2021-07-10 MED ORDER — DEXAMETHASONE SODIUM PHOSPHATE 10 MG/ML IJ SOLN
INTRAMUSCULAR | Status: DC | PRN
  Administered 2021-07-10: 10 mg via INTRAVENOUS

## 2021-07-10 MED ORDER — ALBUMIN HUMAN 5 % IV SOLN
INTRAVENOUS | Status: AC
  Filled 2021-07-10: qty 250

## 2021-07-10 MED ORDER — PHENYLEPHRINE 40 MCG/ML (10ML) SYRINGE FOR IV PUSH (FOR BLOOD PRESSURE SUPPORT)
PREFILLED_SYRINGE | INTRAVENOUS | Status: AC
  Filled 2021-07-10: qty 10

## 2021-07-10 MED ORDER — SCOPOLAMINE 1 MG/3DAYS TD PT72
MEDICATED_PATCH | TRANSDERMAL | Status: AC
  Filled 2021-07-10: qty 1

## 2021-07-10 MED ORDER — KETOROLAC TROMETHAMINE 30 MG/ML IJ SOLN: 30.0000 mg | Freq: Four times a day (QID) | INTRAMUSCULAR | Status: AC | PRN

## 2021-07-10 MED ORDER — NALBUPHINE HCL 10 MG/ML IJ SOLN: 5.0000 mg | INTRAMUSCULAR | Status: DC | PRN

## 2021-07-10 MED ORDER — LACTATED RINGERS IV SOLN: INTRAVENOUS | Status: DC

## 2021-07-10 MED ORDER — PRENATAL MULTIVITAMIN CH
1.0000 | ORAL_TABLET | Freq: Every day | ORAL | Status: DC
  Administered 2021-07-10 – 2021-07-12 (×3): 1 via ORAL
  Filled 2021-07-10 (×3): qty 1

## 2021-07-10 MED ORDER — SENNOSIDES-DOCUSATE SODIUM 8.6-50 MG PO TABS
2.0000 | ORAL_TABLET | Freq: Every day | ORAL | Status: DC
  Administered 2021-07-11 – 2021-07-12 (×2): 2 via ORAL
  Filled 2021-07-10 (×2): qty 2

## 2021-07-10 MED ORDER — NALOXONE HCL 4 MG/10ML IJ SOLN
1.0000 ug/kg/h | INTRAVENOUS | Status: DC | PRN
  Filled 2021-07-10: qty 5

## 2021-07-10 MED ORDER — MEPERIDINE HCL 25 MG/ML IJ SOLN
INTRAMUSCULAR | Status: DC | PRN
  Administered 2021-07-10 (×2): 12.5 mg via INTRAVENOUS

## 2021-07-10 MED ORDER — MORPHINE SULFATE (PF) 0.5 MG/ML IJ SOLN
INTRAMUSCULAR | Status: DC | PRN
  Administered 2021-07-10: 3 mg via EPIDURAL

## 2021-07-10 MED ORDER — FENTANYL CITRATE (PF) 100 MCG/2ML IJ SOLN: 25.0000 ug | INTRAMUSCULAR | Status: DC | PRN

## 2021-07-10 MED ORDER — SIMETHICONE 80 MG PO CHEW: 80.0000 mg | CHEWABLE_TABLET | ORAL | Status: DC | PRN

## 2021-07-10 MED ORDER — NALOXONE HCL 0.4 MG/ML IJ SOLN: 0.4000 mg | INTRAMUSCULAR | Status: DC | PRN

## 2021-07-10 MED ORDER — CEFAZOLIN SODIUM-DEXTROSE 2-4 GM/100ML-% IV SOLN
2.0000 g | Freq: Once | INTRAVENOUS | Status: AC
  Administered 2021-07-10: 2 g via INTRAVENOUS

## 2021-07-10 MED ORDER — WITCH HAZEL-GLYCERIN EX PADS: 1.0000 "application " | MEDICATED_PAD | CUTANEOUS | Status: DC | PRN

## 2021-07-10 SURGICAL SUPPLY — 36 items
ADH SKN CLS APL DERMABOND .7 (GAUZE/BANDAGES/DRESSINGS)
APL SKNCLS STERI-STRIP NONHPOA (GAUZE/BANDAGES/DRESSINGS) ×1
BENZOIN TINCTURE PRP APPL 2/3 (GAUZE/BANDAGES/DRESSINGS) ×2 IMPLANT
CHLORAPREP W/TINT 26ML (MISCELLANEOUS) ×2 IMPLANT
CLAMP CORD UMBIL (MISCELLANEOUS) IMPLANT
CLOSURE STERI STRIP 1/2 X4 (GAUZE/BANDAGES/DRESSINGS) ×1 IMPLANT
CLOTH BEACON ORANGE TIMEOUT ST (SAFETY) ×2 IMPLANT
DERMABOND ADVANCED (GAUZE/BANDAGES/DRESSINGS)
DERMABOND ADVANCED .7 DNX12 (GAUZE/BANDAGES/DRESSINGS) IMPLANT
DRSG OPSITE POSTOP 4X10 (GAUZE/BANDAGES/DRESSINGS) ×2 IMPLANT
ELECT REM PT RETURN 9FT ADLT (ELECTROSURGICAL) ×2
ELECTRODE REM PT RTRN 9FT ADLT (ELECTROSURGICAL) ×1 IMPLANT
EXTRACTOR VACUUM KIWI (MISCELLANEOUS) IMPLANT
GLOVE BIO SURGEON STRL SZ 6 (GLOVE) ×2 IMPLANT
GLOVE BIOGEL PI IND STRL 6 (GLOVE) ×2 IMPLANT
GLOVE BIOGEL PI IND STRL 7.0 (GLOVE) ×1 IMPLANT
GLOVE BIOGEL PI INDICATOR 6 (GLOVE) ×2
GLOVE BIOGEL PI INDICATOR 7.0 (GLOVE) ×1
GOWN STRL REUS W/TWL LRG LVL3 (GOWN DISPOSABLE) ×4 IMPLANT
KIT ABG SYR 3ML LUER SLIP (SYRINGE) ×2 IMPLANT
NDL HYPO 25X5/8 SAFETYGLIDE (NEEDLE) ×1 IMPLANT
NEEDLE HYPO 25X5/8 SAFETYGLIDE (NEEDLE) ×2 IMPLANT
NS IRRIG 1000ML POUR BTL (IV SOLUTION) ×2 IMPLANT
PACK C SECTION WH (CUSTOM PROCEDURE TRAY) ×2 IMPLANT
PAD OB MATERNITY 4.3X12.25 (PERSONAL CARE ITEMS) ×2 IMPLANT
PENCIL SMOKE EVAC W/HOLSTER (ELECTROSURGICAL) ×2 IMPLANT
STRIP CLOSURE SKIN 1/2X4 (GAUZE/BANDAGES/DRESSINGS) IMPLANT
SUT CHROMIC 0 CTX 36 (SUTURE) ×6 IMPLANT
SUT MON AB 2-0 CT1 27 (SUTURE) ×2 IMPLANT
SUT PDS AB 0 CT1 27 (SUTURE) IMPLANT
SUT PLAIN 0 NONE (SUTURE) IMPLANT
SUT VIC AB 0 CT1 36 (SUTURE) IMPLANT
SUT VIC AB 4-0 KS 27 (SUTURE) IMPLANT
TOWEL OR 17X24 6PK STRL BLUE (TOWEL DISPOSABLE) ×2 IMPLANT
TRAY FOLEY W/BAG SLVR 14FR LF (SET/KITS/TRAYS/PACK) IMPLANT
WATER STERILE IRR 1000ML POUR (IV SOLUTION) ×3 IMPLANT

## 2021-07-10 NOTE — Lactation Note (Signed)
This note was copied from a baby's chart. Lactation Consultation Note  Patient Name: Heather Munoz [id number] Date: 07/10/2021 Reason for consult: Mother's request;Term Age:29 hours  Follow up visit to 8 hours infant, per mother's request. Mother is a primipara, first-time breastfeeding.  Demonstrated how to set up support for football position to left breast. Hand expressed and observed abundant colostrum. Latched infant after a couple of attempts. Noted suckling and audible swallowing. Mother denies discomfort or pain.   Educated mom about deep latch for an optimal breastfeeding session.  Plan:   1-Breastfeeding on demand, ensuring a deep, comfortable latch.  2-Undressing infant and place skin to skin when ready to breastfeed 3-Keep infant awake during breastfeeding session: massaging breast, infant's hand/shoulder/feet 4-Monitor voids and stools as signs good intake.  5-Contact LC as needed for feeds/support/concerns/questions   Maternal Data Has patient been taught Hand Expression?: Yes Does the patient have breastfeeding experience prior to this delivery?: No  Feeding Mother's Current Feeding Choice: Breast Milk  LATCH Score Latch: Grasps breast easily, tongue down, lips flanged, rhythmical sucking.  Audible Swallowing: Spontaneous and intermittent  Type of Nipple: Everted at rest and after stimulation  Comfort (Breast/Nipple): Soft / non-tender  Hold (Positioning): Assistance needed to correctly position infant at breast and maintain latch.  LATCH Score: 9  Interventions Interventions: Breast feeding basics reviewed;Assisted with latch;Skin to skin;Breast massage;Hand express;Breast compression;Adjust position;Support pillows;Position options;Expressed milk;Education  Discharge Pump: Personal (spectra) WIC Program: No  Consult Status Consult Status: Follow-up Date: 07/11/21 Follow-up type: In-patient    Heather Munoz 07/10/2021, 12:30  PM

## 2021-07-10 NOTE — Anesthesia Preprocedure Evaluation (Addendum)
Anesthesia Evaluation  Patient identified by MRN, date of birth, ID band Patient awake    Reviewed: Allergy & Precautions, Patient's Chart, lab work & pertinent test results  Airway Mallampati: II  TM Distance: >3 FB Neck ROM: Full    Dental no notable dental hx. (+) Teeth Intact   Pulmonary neg pulmonary ROS,    Pulmonary exam normal breath sounds clear to auscultation       Cardiovascular negative cardio ROS Normal cardiovascular exam Rhythm:Regular Rate:Normal     Neuro/Psych PSYCHIATRIC DISORDERS ADHDnegative neurological ROS     GI/Hepatic Neg liver ROS, GERD  ,  Endo/Other  negative endocrine ROS  Renal/GU negative Renal ROS  negative genitourinary   Musculoskeletal negative musculoskeletal ROS (+)   Abdominal   Peds  Hematology  (+) anemia ,   Anesthesia Other Findings   Reproductive/Obstetrics (+) Pregnancy                             Anesthesia Physical Anesthesia Plan  ASA: 2 and emergent  Anesthesia Plan: Epidural   Post-op Pain Management:    Induction:   PONV Risk Score and Plan: 4 or greater and Treatment may vary due to age or medical condition and Scopolamine patch - Pre-op  Airway Management Planned: Natural Airway  Additional Equipment:   Intra-op Plan:   Post-operative Plan:   Informed Consent: I have reviewed the patients History and Physical, chart, labs and discussed the procedure including the risks, benefits and alternatives for the proposed anesthesia with the patient or authorized representative who has indicated his/her understanding and acceptance.     Dental advisory given  Plan Discussed with: Anesthesiologist and CRNA  Anesthesia Plan Comments: (Patient for C/Section for fetal intolerance to labor. Will use epidural for C/Section. M. Malen Gauze, MD)      Anesthesia Quick Evaluation

## 2021-07-10 NOTE — Anesthesia Postprocedure Evaluation (Signed)
Anesthesia Post Note  Patient: Heather Munoz  Procedure(s) Performed: CESAREAN SECTION     Patient location during evaluation: PACU Anesthesia Type: Epidural Level of consciousness: oriented and awake and alert Pain management: pain level controlled Vital Signs Assessment: post-procedure vital signs reviewed and stable Respiratory status: spontaneous breathing, respiratory function stable and nonlabored ventilation Cardiovascular status: blood pressure returned to baseline and stable Postop Assessment: no headache, no backache, no apparent nausea or vomiting, patient able to bend at knees and epidural receding Anesthetic complications: no   No notable events documented.  Last Vitals:  Vitals:   07/10/21 0530 07/10/21 0545  BP: (!) 103/57 115/71  Pulse:    Resp: 12 (!) 21  Temp: 36.7 C   SpO2:      Last Pain:  Vitals:   07/10/21 0545  TempSrc:   PainSc: 0-No pain   Pain Goal:    LLE Motor Response: Purposeful movement (07/10/21 0545) LLE Sensation: Full sensation (07/10/21 0545) RLE Motor Response: Purposeful movement (07/10/21 0545) RLE Sensation: Full sensation (07/10/21 0545)     Epidural/Spinal Function Cutaneous sensation: Normal sensation (07/10/21 0545), Patient able to flex knees: Yes (07/10/21 0545), Patient able to lift hips off bed: Yes (07/10/21 0545), Back pain beyond tenderness at insertion site: No (07/10/21 0545), Progressively worsening motor and/or sensory loss: No (07/10/21 0545), Bowel and/or bladder incontinence post epidural: No (07/10/21 0545)  [name] A.

## 2021-07-10 NOTE — Lactation Note (Signed)
This note was copied from a baby's chart. Lactation Consultation Note  Patient Name: Heather Munoz [id number] Date: 07/10/2021 Reason for consult: Initial assessment;Primapara;Term Age:29 hours  P1 mom with son "[name]" sleeping STS.  She recently fed.  She states she feels some pinching when he feeds at the breast.  She had breast changes with pregnancy.    Mom has slightly pink nipples and some white bumps noted on right nipple where infant had previously fed. Mom said her nipples now do not look any different than before delivery.   Mom has several questions regarding feeding, pumping, and milk transfer.  All were answered.  BF basics reveiwed, STS, 8-12 feeds in 24 hours, and feeding cues.  Brochure provided.  She is aware of OP LC services and BFSG and phone line.     Maternal Data Has patient been taught Hand Expression?: Yes Does the patient have breastfeeding experience prior to this delivery?: No  Feeding Mother's Current Feeding Choice: Breast Milk  LATCH Score Latch: Repeated attempts needed to sustain latch, nipple held in mouth throughout feeding, stimulation needed to elicit sucking reflex.  Audible Swallowing: A few with stimulation  Type of Nipple: Everted at rest and after stimulation  Comfort (Breast/Nipple): Soft / non-tender  Hold (Positioning): Assistance needed to correctly position infant at breast and maintain latch.  LATCH Score: 7   Lactation Tools Discussed/Used    Interventions Interventions: Breast feeding basics reviewed;Skin to skin;Education;Hand express  Discharge Pump: Personal (spectra) WIC Program: No  Consult Status Consult Status: Follow-up Date: 07/11/21 Follow-up type: In-patient    Maryruth Hancock [facility] 07/10/2021, 9:35 AM

## 2021-07-10 NOTE — Transfer of Care (Signed)
Immediate Anesthesia Transfer of Care Note  Patient: Heather Munoz  Procedure(s) Performed: CESAREAN SECTION  Patient Location: PACU  Anesthesia Type:epidural  Level of Consciousness: awake  Airway & Oxygen Therapy: Patient Spontanous Breathing  Post-op Assessment: Report given to RN and Post -op Vital signs reviewed and stable  Post vital signs: Reviewed and stable  Last Vitals:  Vitals Value Taken Time  BP 107/72 07/10/21 0452  Temp    Pulse 125 07/10/21 0456  Resp 17 07/10/21 0456  SpO2 97 % 07/10/21 0456  Vitals shown include unvalidated device data.  Last Pain:  Vitals:   07/09/21 2350  TempSrc:   PainSc: 6          Complications: No notable events documented.

## 2021-07-10 NOTE — Op Note (Signed)
[place] PROCEDURE DATE: 07/09/2021 - 07/10/2021  PREOPERATIVE DIAGNOSIS: Intrauterine pregnancy at  [redacted]w[redacted]d weeks gestation, non-reassuring fetal tracing  POSTOPERATIVE DIAGNOSIS: The same plus OP presentation  PROCEDURE:  Primary Low Transverse Cesarean Section  SURGEON:  Dr. Mitchel Honour  INDICATIONS: Heather Munoz is a 30 y.o. G2P0010 at [redacted]w[redacted]d scheduled for cesarean section secondary to non-reassuring fetal monitoring.  The risks of cesarean section discussed with the patient included but were not limited to: bleeding which may require transfusion or reoperation; infection which may require antibiotics; injury to bowel, bladder, ureters or other surrounding organs; injury to the fetus; need for additional procedures including hysterectomy in the event of a life-threatening hemorrhage; placental abnormalities wth subsequent pregnancies, incisional problems, thromboembolic phenomenon and other postoperative/anesthesia complications. The patient concurred with the proposed plan, giving informed written consent for the procedure.    FINDINGS:  Viable female infant in cephalic presentation (OP), APGARs 8,9:  weight pending  Clear amniotic fluid.  Intact placenta, three vessel cord.  Grossly normal uterus, ovaries and fallopian tubes. .   ANESTHESIA:    Epidural ESTIMATED BLOOD LOSS: 600 ml SPECIMENS: Placenta sent to L&D COMPLICATIONS: None immediate  PROCEDURE IN DETAIL:  The patient received intravenous antibiotics and had sequential compression devices applied to her lower extremities while in the preoperative area.  She was then taken to the operating room where epidural anesthesia was dosed up to surgical level and was found to be adequate. She was then placed in a dorsal supine position with a leftward tilt, and prepped and draped in a sterile manner.  A foley catheter was placed into her bladder and attached to constant gravity.  After an adequate timeout was performed, a Pfannenstiel  skin incision was made with scalpel and carried through to the underlying layer of fascia. The fascia was incised in the midline and this incision was extended bilaterally using the Mayo scissors. Kocher clamps were applied to the superior aspect of the fascial incision and the underlying rectus muscles were dissected off bluntly. A similar process was carried out on the inferior aspect of the facial incision. The rectus muscles were separated in the midline bluntly and the peritoneum was entered bluntly.  Bladder flap was created sharply and developed bluntly.  Bladder blade was placed.  A transverse hysterotomy was made with a scalpel and extended bilaterally bluntly. The bladder blade was then removed. The infant was successfully delivered, and cord was clamped and cut and infant was handed over to awaiting neonatology team. Uterine massage was then administered and the placenta delivered intact with three-vessel cord. The uterus was cleared of clot and debris.  The hysterotomy was closed with 0 chromic.  A second imbricating suture of 0-chromic was used to reinforce the incision and aid in hemostasis.  The peritoneum and rectus muscles were noted to be hemostatic and were reapproximated using 2-0 monocryl in a running fashion.  The fascia was closed with 0-Vicryl in a running fashion with good restoration of anatomy.  The subcutaneus tissue was copiously irrigated.  The skin was closed with 4-0 vicryl in a subcuticular fashion.  Pt tolerated the procedure will.  All counts were correct x2.  Pt went to the recovery room in stable condition.

## 2021-07-10 NOTE — Anesthesia Procedure Notes (Signed)
Epidural Patient location during procedure: OB Start time: 07/10/2021 12:35 AM End time: 07/10/2021 12:43 AM  Staffing Anesthesiologist: Mal Amabile, MD Performed: anesthesiologist   Preanesthetic Checklist Completed: patient identified, IV checked, site marked, risks and benefits discussed, surgical consent, monitors and equipment checked, pre-op evaluation and timeout performed  Epidural Patient position: sitting Prep: DuraPrep and site prepped and draped Patient monitoring: continuous pulse ox and blood pressure Approach: midline Injection technique: LOR air  Needle:  Needle type: Tuohy  Needle gauge: 17 G Needle length: 9 cm and 9 Needle insertion depth: 5 cm cm Catheter type: closed end flexible Catheter size: 19 Gauge Catheter at skin depth: 10 cm Test dose: negative and Other  Assessment Events: blood not aspirated, injection not painful, no injection resistance, no paresthesia and negative IV test  Additional Notes Patient identified. Risks and benefits discussed including failed block, incomplete  Pain control, post dural puncture headache, nerve damage, paralysis, blood pressure Changes, nausea, vomiting, reactions to medications-both toxic and allergic and post Partum back pain. All questions were answered. Patient expressed understanding and wished to proceed. Sterile technique was used throughout procedure. Epidural site was Dressed with sterile barrier dressing. No paresthesias, signs of intravascular injection Or signs of intrathecal spread were encountered.  Patient was more comfortable after the epidural was dosed. Please see RN's note for documentation of vital signs and FHR which are stable. Reason for block:procedure for pain

## 2021-07-10 NOTE — Social Work (Signed)
MOB was referred for history of anxiety.   * Referral screened out by Clinical Social Worker because none of the following criteria appear to apply:  ~ History of anxiety/depression during this pregnancy, or of post-partum depression following prior delivery. No concerns noted in prenatal care. ~ Diagnosis of anxiety and/or depression within last 3 years. Per chart review, occurred in 2019 (situational).  OR * MOB's symptoms currently being treated with medication and/or therapy.  Please contact the Clinical Social Worker if needs arise, by [facility] request, or if MOB scores greater than 9/yes to question 10 on Edinburgh Postpartum Depression Screen.  Manfred Arch, LCSWA Clinical Social Work [employer] and [employer]  [phone]

## 2021-07-10 NOTE — Progress Notes (Signed)
I received a call at 0249 that patient has been having repetitive late decelerations for hours; multiple position changes attempted.  I was in house and immediately in to assess patient.  Pitocin had been turned off prior to my arrival.  Cervix 6-7/75/-2.  IUPC and FSE placed.  Multiple position were attempted with best fetal response on hands and knees.  Terbutaline was given.  There was no improvement in repetitive late decelerations.  C/S was called.  Patient is informed of risk of bleeding, infection, scarring and damage to surrounding structures.  She is informed of implications in future pregnancies including uterine rupture and abnormal placentation.  All questions were answered and patient wishes to proceed.    Mitchel Honour, DO

## 2021-07-11 NOTE — Progress Notes (Signed)
Subjective: Postpartum Day 1: Cesarean Delivery Patient reports incisional pain, tolerating PO, + flatus, and no problems voiding.    Objective: Vital signs in last 24 hours: Temp:  [98 F (36.7 C)-98.7 F (37.1 C)] 98.3 F (36.8 C) (07/22 0514) Pulse Rate:  [78-86] 86 (07/22 0514) Resp:  [17-18] 17 (07/22 0514) BP: (96-105)/(57-64) 98/61 (07/22 0514) SpO2:  [96 %-98 %] 97 % (07/22 0514)  Physical Exam:  General: alert, cooperative, appears stated age, and no distress Lochia: appropriate Uterine Fundus: firm Incision: healing well DVT Evaluation: No evidence of DVT seen on physical exam.  Recent Labs    07/09/21 1359 07/10/21 0718  HGB 11.4* 10.0*  HCT 34.0* 28.8*    Assessment/Plan: Status post Cesarean section. Doing well postoperatively.  Continue current care Desires circumcision of baby.  R&B discussed.  Turner Daniels 07/11/2021, 9:07 AM

## 2021-07-11 NOTE — Lactation Note (Signed)
This note was copied from a baby's chart. Lactation Consultation Note  Patient Name: Heather Munoz [id number] Date: 07/11/2021 Reason for consult: Initial assessment;Mother's request;1st time breastfeeding;Term;Infant weight loss Age:29 hours  Mother states latch been painful. Compression stripes noted and bruising redness on both nipples left more so than right. Mom to use eBM for wound healing. RN to provided coconut oil for nipple care.   LC not able to see a latch since infant getting circ. We did review different positions, placing infant in laid back position to get a deeper latch, flanging out lips and extending feedings with breast compression looking for signs of milk transfer.   Plan 1. To feed based on cues 8-12x in 24 hr period no more than 4 hrs without an attempt. Mom to offer both breasts and look for signs of milk transfer.  2. Mom to EBM via spoon or cup feeder if infant still cueing after latching at breast or going back to breast frequently.  3. Manual pump provided for comfort, breasts are dense and full but no evidence of hardness.  4. Mom to monitor feeds on I and O sheet 5. [facility] brochure of inpatient and outpatient services reviewed.   All questions answered at the end of the visit.   Maternal Data Has patient been taught Hand Expression?: Yes Does the patient have breastfeeding experience prior to this delivery?: No  Feeding Mother's Current Feeding Choice: Breast Milk  LATCH Score                    Lactation Tools Discussed/Used Tools: Pump Breast pump type: Manual Pump Education: Setup, frequency, and cleaning;Milk Storage Reason for Pumping: mothers request Pumping frequency: breast are full infant getting circ. Mom to pump or hand express for comfort prn  Interventions Interventions: Breast feeding basics reviewed;Support pillows;Education;Assisted with latch;Position options;Skin to skin;Expressed milk;Breast massage;Coconut oil;Hand  express;Hand pump;Breast compression  Discharge Pump: Personal  Consult Status Consult Status: Follow-up Date: 07/12/21 Follow-up type: In-patient    Taite Baldassari  Nicholson-Springer 07/11/2021, 5:28 PM

## 2021-07-11 NOTE — Lactation Note (Addendum)
This note was copied from a baby's chart. Lactation Consultation Note  Patient Name: Heather Munoz Date: 07/11/2021 Reason for consult: Initial assessment;Mother's request;1st time breastfeeding;Term;Infant weight loss Age:29 hours   Infant returned from circ. Mom asked for assistance with latching. Infant thick labial attachment and high palate. He can extend his tongue pass the gum line. Infant not able to latch at this time. But he was able to take 4 ml of eBM via finger feeding with cup feeder.   Once parents completed feeding of 4 ml of EBM, LC assisted latching infant in football on the left breast with audible signs of milk transfer. Mom denied any pain with the latch.  Maternal Data Has patient been taught Hand Expression?: Yes Does the patient have breastfeeding experience prior to this delivery?: No  Feeding Mother's Current Feeding Choice: Breast Milk  LATCH Score                    Lactation Tools Discussed/Used Tools: Pump;Flanges Flange Size: 21 Breast pump type: Manual Pump Education: Setup, frequency, and cleaning;Milk Storage Reason for Pumping: mothers request Pumping frequency: breasts are full infant getting circ. Mom to pump or hand express for comfort prn until infant returns to feed  Interventions Interventions: Breast feeding basics reviewed;Support pillows;Education;Assisted with latch;Position options;Skin to skin;Expressed milk;Breast massage;Coconut oil;Hand express;Hand pump;Breast compression  Discharge Pump: Personal  Consult Status Consult Status: Follow-up Date: 07/12/21 Follow-up type: In-patient    Heather Goering  Munoz 07/11/2021, 7:32 PM

## 2021-07-12 MED ORDER — IBUPROFEN 800 MG PO TABS: 800.0000 mg | ORAL_TABLET | Freq: Three times a day (TID) | ORAL | 0 refills | Status: DC | PRN

## 2021-07-12 NOTE — Discharge Summary (Signed)
   Postpartum Discharge Summary      Patient Name: Heather Munoz DOB: 09/01/1991 MRN: [id number]  Date of admission: 07/09/2021 Delivery date:07/10/2021  Delivering provider: MORRIS, MEGAN  Date of discharge: 07/12/2021  Admitting diagnosis: Pregnancy [Z34.90] S/P cesarean section [Z98.891] Intrauterine pregnancy: [redacted]w[redacted]d     Secondary diagnosis:  Active Problems:   Pregnancy   S/P cesarean section  Additional problems:      Discharge diagnosis: Term Pregnancy Delivered                                              Post partum procedures:   Augmentation: AROM, Pitocin, and Cytotec Complications: None  Hospital course: Induction of Labor With Cesarean Section   30 y.o. yo G3P1011 at [redacted]w[redacted]d was admitted to the hospital 07/09/2021 for induction of labor. Patient had a labor course significant for fetal intolerance to labor. The patient went for cesarean section due to Non-Reassuring FHR. Delivery details are as follows: Membrane Rupture Time/Date: 7:50 PM ,07/09/2021   Delivery Method:C-Section, Low Transverse  Details of operation can be found in separate operative Note.  Patient had an uncomplicated postpartum course. She is ambulating, tolerating a regular diet, passing flatus, and urinating well.  Patient is discharged home in stable condition on 07/12/21.      Newborn Data: Birth date:07/10/2021  Birth time:4:01 AM  Gender:Female  Living status:Living  Apgars:8 ,9  Weight:3475 g                                Magnesium Sulfate received: No BMZ received: No Rhophylac:N/A MMR:N/A T-DaP:Given prenatally Flu: Yes Transfusion:No  Physical exam  Vitals:   07/10/21 2354 07/11/21 0514 07/11/21 2047 07/12/21 0449  BP: (!) 96/57 98/61 101/71 101/66  Pulse: 78 86 68 78  Resp:  17 18 19  Temp: 98 F (36.7 C) 98.3 F (36.8 C) 98.3 F (36.8 C) 98.1 F (36.7 C)  TempSrc: Oral Oral Oral Oral  SpO2: 96% 97% 97% 98%  Weight:      Height:       General: alert, cooperative, and  no distress Lochia: appropriate Uterine Fundus: firm Incision: Healing well with no significant drainage DVT Evaluation: No evidence of DVT seen on physical exam. Labs: Lab Results  Component Value Date   WBC 17.3 (H) 07/10/2021   HGB 10.0 (L) 07/10/2021   HCT 28.8 (L) 07/10/2021   MCV 90.0 07/10/2021   PLT 147 (L) 07/10/2021   CMP Latest Ref Rng & Units 11/09/2020  Glucose 70 - 99 mg/dL 105(H)  BUN 6 - 20 mg/dL 12  Creatinine 0.44 - 1.00 mg/dL 0.81  Sodium 135 - 145 mmol/L 137  Potassium 3.5 - 5.1 mmol/L 3.8  Chloride 98 - 111 mmol/L 106  CO2 22 - 32 mmol/L 20(L)  Calcium 8.9 - 10.3 mg/dL 9.2  Total Protein 6.5 - 8.1 g/dL 6.7  Total Bilirubin 0.3 - 1.2 mg/dL 0.7  Alkaline Phos 38 - 126 U/L 28(L)  AST 15 - 41 U/L 18  ALT 0 - 44 U/L 16   Edinburgh Score: Edinburgh Postnatal Depression Scale Screening Tool 07/11/2021  I have been able to laugh and see the funny side of things. 0  I have looked forward with enjoyment to things. 0  I have blamed myself unnecessarily when things went   wrong. 1  I have been anxious or worried for no good reason. 1  I have felt scared or panicky for no good reason. 0  Things have been getting on top of me. 0  I have been so unhappy that I have had difficulty sleeping. 0  I have felt sad or miserable. 0  I have been so unhappy that I have been crying. 0  The thought of harming myself has occurred to me. 0  Edinburgh Postnatal Depression Scale Total 2      After visit meds:  Allergies as of 07/12/2021   No Known Allergies      Medication List     STOP taking these medications    amphetamine-dextroamphetamine 10 MG tablet Commonly known as: Adderall   norethindrone-ethinyl estradiol-FE 1-20 MG-MCG tablet Commonly known as: LOESTRIN FE       TAKE these medications    ibuprofen 800 MG tablet Commonly known as: ADVIL Take 1 tablet (800 mg total) by mouth every 8 (eight) hours as needed for mild pain, moderate pain or cramping.    prenatal multivitamin Tabs tablet Take 1 tablet by mouth daily at 12 noon.         Discharge home in stable condition Infant Feeding: Breast Infant Disposition:home with mother Discharge instruction: per After Visit Summary and Postpartum booklet. Activity: Advance as tolerated. Pelvic rest for 6 weeks.  Diet: routine diet Anticipated Birth Control: Unsure Postpartum Appointment:6 weeks Additional Postpartum F/U:    Future Appointments:No future appointments. Follow up Visit:      07/12/2021 Luz Lex, MD

## 2021-07-12 NOTE — Lactation Note (Signed)
This note was copied from a baby's chart. Lactation Consultation Note  Patient Name: Heather Munoz OEVOJ'J Date: 07/12/2021 Reason for consult: Follow-up assessment;Primapara;1st time breastfeeding;Term Age:29 hours   P1 mother whose infant is now 15 hours old.  This is a term baby at 40+3 weeks.    Nurse tech was in room and came out stating that mother was crying.  Visited family to assess the situation: mother was crying and very upset. Father was trying to comfort baby who was also crying.  Offered to assist with feeding.  Mother stated that he had been feeding for the last 4 hours and her nipples were extremely sensitive. She was not interested in latching. Offered supplementation with donor breast milk or formula.  Mother interested in using formula.  Demonstrated paced bottle feeding using the green slow flow nipple.  Allowed father to feed while I observed and baby easily consumed 11 mls without difficulty.  Burped and became very content.    Reassurance and emotional support provided.  Reviewed breast feeding basics and milk "coming to volume."  Mother's nipples are reddened and irritated.  The right nipple has old bruising to the nipple.  Encouraged EBM to nipples with every feeding followed by coconut oil.  Mother also has comfort gels at bedside.  Discussed feeding plan for after discharge and suggested mother pump after feedings at home for extra supplementation as needed.  Mother interested in doing this.  She reported that would help reassure her and help her be less anxious about baby's volume of EBM he obtains. Verbalized to the parents that it is "okay" to give supplementation until mother is able to exclusively breast feed.  Supplementation guidelines given.  Engorgement prevention/treatment reviewed.  Mother has a manual pump and a DEBP for home use.  Father supportive.   Maternal Data Has patient been taught Hand Expression?: Yes Does the patient have breastfeeding  experience prior to this delivery?: No  Feeding Mother's Current Feeding Choice: Breast Milk and Formula Nipple Type: Slow - flow  LATCH Score                    Lactation Tools Discussed/Used    Interventions    Discharge Discharge Education: Engorgement and breast care Pump: Personal;Manual  Consult Status Consult Status: Complete Date: 07/12/21 Follow-up type: Call as needed    Amaiah Cristiano R Tenille Morrill 07/12/2021, 6:11 AM

## 2021-07-22 ENCOUNTER — Telehealth (HOSPITAL_COMMUNITY): Payer: Self-pay | Admitting: *Deleted

## 2021-07-22 NOTE — Telephone Encounter (Signed)
Patient voiced no questions or concerns regarding her health. EPDS = 4. Patient voiced no questions or concerns regarding baby at this time. Reported infant sleeps in a bassinet on his back. RN reviewed ABCs of safe sleep - patient verbalized understanding. Patient requested RN email information regarding the virtual Baby and Me class and the virtual breastfeeding and pumping support groups. Email sent. Deforest Hoyles, RN, 07/22/21, [phone].

## 2022-09-03 ENCOUNTER — Other Ambulatory Visit: Payer: Self-pay | Admitting: Nurse Practitioner

## 2022-09-03 ENCOUNTER — Ambulatory Visit: Payer: Self-pay

## 2022-09-03 DIAGNOSIS — Z0000 Encounter for general adult medical examination without abnormal findings: Secondary | ICD-10-CM

## 2022-12-21 NOTE — L&D Delivery Note (Deleted)
Delivery Note At  a viable newborn was delivered via  (Presentation:  LOA ).  APGAR: pending; weight pending.   Placenta status: routine ,  .  Cord:  3VC with the following complications:  none  Cord pH: not sent Easy delivery of fetal head and body delivered over one contraction.    Anesthesia: Epidural Episiotomy:  NONE Lacerations:  NONE Suture Repair:  NONE Est. Blood Loss (mL): 202cc    It's a boy - "Heather Munoz"!!  Mom to postpartum.  Baby to Couplet care / Skin to Skin.  [name] 08/16/2023, 2:29 AM

## 2022-12-21 NOTE — L&D Delivery Note (Signed)
Operative Delivery Note At 10:36 AM a viable female was delivered via Vaginal, Vacuum Investment banker, operational).  Presentation: vertex; Position: Occiput,, Posterior; Station: +2.  Verbal consent: obtained from patient.  Risks and benefits discussed in detail.  Risks include, but are not limited to the risks of anesthesia, bleeding, infection, damage to maternal tissues, fetal cephalhematoma.  There is also the risk of inability to effect vaginal delivery of the head, or shoulder dystocia that cannot be resolved by established maneuvers, leading to the need for emergency cesarean section.  Pushing with excellent effort. Recurrent deep variables to 60s with slow recovery - I discussed operative delivery with the patient and her partner and they verbally consented. Kiwi vacuum was placed - good descent noted with first two pulls. Rotation of the head to OA with third pull and the head was delivered without complications. Nuchal x1, delivered through.  APGAR: 8, 9; weight 7 lb 1.9 oz (3230 g).   Placenta status: spontaneous, intact Cord pH: pending  Anesthesia: Epidural Instruments: Kiwi Vacuum Episiotomy: None Lacerations: 2nd degree - confirmed with rectovaginal exam Suture Repair: 3.0 vicryl Est. Blood Loss (mL): 272ccs  Mom to postpartum.  Baby to Couplet care / Skin to Skin.  Tawni Levy 08/16/2023, 10:57 AM

## 2023-01-06 LAB — OB RESULTS CONSOLE RUBELLA ANTIBODY, IGM: Rubella: IMMUNE

## 2023-01-06 LAB — HEPATITIS C ANTIBODY: HCV Ab: NEGATIVE

## 2023-01-06 LAB — OB RESULTS CONSOLE ABO/RH: RH Type: POSITIVE

## 2023-01-06 LAB — OB RESULTS CONSOLE HIV ANTIBODY (ROUTINE TESTING): HIV: NONREACTIVE

## 2023-01-06 LAB — OB RESULTS CONSOLE ANTIBODY SCREEN: Antibody Screen: NEGATIVE

## 2023-01-06 LAB — OB RESULTS CONSOLE RPR: RPR: NONREACTIVE

## 2023-01-06 LAB — OB RESULTS CONSOLE HEPATITIS B SURFACE ANTIGEN: Hepatitis B Surface Ag: NEGATIVE

## 2023-01-18 LAB — OB RESULTS CONSOLE GC/CHLAMYDIA
Chlamydia: NEGATIVE
Neisseria Gonorrhea: NEGATIVE

## 2023-01-27 ENCOUNTER — Inpatient Hospital Stay (HOSPITAL_COMMUNITY)
Admission: AD | Admit: 2023-01-27 | Discharge: 2023-01-27 | Disposition: A | Discharge status: Home / Self Care | Payer: Managed Care, Other (non HMO) | Attending: Obstetrics and Gynecology | Admitting: Obstetrics and Gynecology

## 2023-01-27 ENCOUNTER — Encounter: Payer: Self-pay | Admitting: Student

## 2023-01-27 ENCOUNTER — Inpatient Hospital Stay (HOSPITAL_COMMUNITY): Payer: Managed Care, Other (non HMO)

## 2023-01-27 DIAGNOSIS — O418X10 Other specified disorders of amniotic fluid and membranes, first trimester, not applicable or unspecified: Secondary | ICD-10-CM

## 2023-01-27 DIAGNOSIS — O468X1 Other antepartum hemorrhage, first trimester: Secondary | ICD-10-CM

## 2023-01-27 DIAGNOSIS — Z3A11 11 weeks gestation of pregnancy: Secondary | ICD-10-CM | POA: Insufficient documentation

## 2023-01-27 DIAGNOSIS — O208 Other hemorrhage in early pregnancy: Secondary | ICD-10-CM | POA: Insufficient documentation

## 2023-01-27 NOTE — MAU Note (Signed)
Pt says she woke at 12MN- felt like she was voiding on herself-  ran to b-room  Pants - had blood on them- blood in toilet  She called 911-  told her she was stable - but go to hospital.  On pants in triage - underwear- thin red  blood . No pain / no cramps

## 2023-01-27 NOTE — MAU Provider Note (Signed)
History     [id number]  Arrival date and time: 01/27/23 0050    Chief Complaint  Patient presents with   Vaginal Bleeding     HPI Heather Munoz is a 32 y.o. at [redacted]w[redacted]d who presents for vaginal bleeding. Symptoms started at midnight. Woke up and her pants were saturated with dark red blood. Denies complications with this pregnancy. Denies abdominal pain or recent intercourse.    OB History     Gravida  3   Para  1   Term  1   Preterm  0   AB  1   Living  1      SAB  0   IAB  1   Ectopic  0   Multiple  0   Live Births  1           Past Medical History:  Diagnosis Date   ADHD (attention deficit hyperactivity disorder)     Past Surgical History:  Procedure Laterality Date   CESAREAN SECTION N/A 07/10/2021   Procedure: CESAREAN SECTION;  Surgeon: Linda Hedges, DO;  Location: [facility];  Service: Obstetrics;  Laterality: N/A;    Family History  Problem Relation Age of Onset   Healthy Mother    Healthy Father    ADD / ADHD Father    Healthy Maternal Grandmother    Healthy Maternal Grandfather    Healthy Paternal Grandmother    Healthy Paternal 72    Healthy Sister    ADD / ADHD Sister    Healthy Sister    ADD / ADHD Sister     No Known Allergies  No current facility-administered medications on file prior to encounter.   Current Outpatient Medications on File Prior to Encounter  Medication Sig Dispense Refill   Prenatal Vit-Fe Fumarate-FA (PRENATAL MULTIVITAMIN) TABS tablet Take 1 tablet by mouth daily at 12 noon.       ROS Pertinent positives and negative per HPI, all others reviewed and negative  Physical Exam   BP 107/65 (BP Location: Right Arm)   Pulse 73   Temp 98.5 F (36.9 C) (Oral)   Resp 18   Ht 5\' 4"  (1.626 m)   Wt 58.1 kg   LMP 11/07/2022   BMI 21.97 kg/m   Patient Vitals for the past 24 hrs:  BP Temp Temp src Pulse Resp Height Weight  01/27/23 0104 107/65 98.5 F (36.9 C) Oral 73 18 5\' 4"  (1.626 m) 58.1  kg    Physical Exam Vitals and nursing note reviewed.  Constitutional:      General: She is not in acute distress.    Appearance: Normal appearance.  HENT:     Head: Normocephalic and atraumatic.  Eyes:     General: No scleral icterus.    Conjunctiva/sclera: Conjunctivae normal.  Pulmonary:     Effort: Pulmonary effort is normal. No respiratory distress.  Genitourinary:    Comments: Small amount of dark red blood.  Neurological:     Mental Status: She is alert.  Psychiatric:        Mood and Affect: Mood normal.        Behavior: Behavior normal.       Labs No results found for this or any previous visit (from the past 24 hour(s)).  Imaging US OB Comp Less 14 Wks  Result Date: 01/27/2023 CLINICAL DATA:  Vaginal bleeding. EXAM: OBSTETRIC <14 WK ULTRASOUND TECHNIQUE: Transabdominal ultrasound was performed for evaluation of the gestation as well as the maternal uterus  and adnexal regions. COMPARISON:  None Available. FINDINGS: Intrauterine gestational sac: Single Yolk sac:  Not Visualized. Embryo:  Visualized. Cardiac Activity: Visualized. Heart Rate: 154 bpm CRL:   54.0 mm   12 w 0 d                  [place] EDC: August 11, 2023 Subchorionic hemorrhage:  Small Maternal uterus/adnexae: The bilateral ovaries are visualized and are normal in appearance. No pelvic free fluid is seen. IMPRESSION: Single, viable intrauterine pregnancy at approximately 12 weeks and 0 days gestation by ultrasound evaluation. Electronically Signed   By: Virgina Norfolk M.D.   On: 01/27/2023 01:54    MAU Course  Procedures Lab Orders  No laboratory test(s) ordered today   No orders of the defined types were placed in this encounter.  Imaging Orders         US OB Comp Less 14 Wks     MDM RH positive  Ultrasound shows live IUP & small subchorionic hemorrhage Assessment and Plan   1. Subchorionic hematoma in first trimester, single or unspecified fetus   2. [redacted] weeks gestation of pregnancy     -Reviewed bleeding precautions & reasons to return to MAU -Reviewed pelvic rest -Keep scheduled f/u with OB   Jorje Guild, NP 01/27/23 2:19 AM

## 2023-07-22 LAB — OB RESULTS CONSOLE GBS: GBS: NEGATIVE

## 2023-07-31 ENCOUNTER — Inpatient Hospital Stay (HOSPITAL_COMMUNITY)
Admission: AD | Admit: 2023-07-31 | Discharge: 2023-07-31 | Disposition: A | Discharge status: Home / Self Care | Payer: Managed Care, Other (non HMO) | Attending: Obstetrics & Gynecology | Admitting: Obstetrics & Gynecology

## 2023-07-31 ENCOUNTER — Inpatient Hospital Stay (HOSPITAL_COMMUNITY): Payer: Managed Care, Other (non HMO)

## 2023-07-31 ENCOUNTER — Encounter (HOSPITAL_COMMUNITY): Payer: Self-pay | Admitting: Obstetrics & Gynecology

## 2023-07-31 ENCOUNTER — Other Ambulatory Visit: Payer: Self-pay

## 2023-07-31 DIAGNOSIS — Z3A38 38 weeks gestation of pregnancy: Secondary | ICD-10-CM

## 2023-07-31 DIAGNOSIS — Y939 Activity, unspecified: Secondary | ICD-10-CM | POA: Insufficient documentation

## 2023-07-31 DIAGNOSIS — O9A213 Injury, poisoning and certain other consequences of external causes complicating pregnancy, third trimester: Secondary | ICD-10-CM

## 2023-07-31 DIAGNOSIS — W19XXXA Unspecified fall, initial encounter: Secondary | ICD-10-CM

## 2023-07-31 DIAGNOSIS — W109XXA Fall (on) (from) unspecified stairs and steps, initial encounter: Secondary | ICD-10-CM | POA: Diagnosis not present

## 2023-07-31 MED ORDER — CYCLOBENZAPRINE HCL 10 MG PO TABS: 10.0000 mg | ORAL_TABLET | Freq: Two times a day (BID) | ORAL | 0 refills | Status: DC | PRN

## 2023-07-31 NOTE — Discharge Instructions (Signed)
Return to MAU: If you have heavy bleeding that soaks through more that 2 pads per hour for an hour or more If you bleed so much that you feel like you might pass out or you do pass out If you have significant abdominal pain that is not improved with Tylenol 1000 mg every 8 hours as needed for pain If you develop a fever > 100.5

## 2023-07-31 NOTE — MAU Provider Note (Cosign Needed Addendum)
History     CSN: [id number]  Arrival date and time: 07/31/23 1327   Event Date/Time   First Provider Initiated Contact with Patient 07/31/23 1511      Chief Complaint  Patient presents with   Fall   HPI [name] is a 32 y.o. year old G76P1011 female at [redacted]w[redacted]d weeks gestation who presents to MAU reporting at around 1245 today she stepped off the step the wrong way and rolled her LT ankle causing her to fall to the ground. She caught herself on the LT arm and wrist into a grassy surface. She reports she had some soreness in her LT wrist and shoulder; none now. She rates her pain 0/10 at this time. She reports (+) FM. She denies VB, LOF, UC's dizziness, or light-headedness. She receives [facility]; next appt is 08/05/2023.   OB History     Gravida  3   Para  1   Term  1   Preterm  0   AB  1   Living  1      SAB  0   IAB  1   Ectopic  0   Multiple  0   Live Births  1           Past Medical History:  Diagnosis Date   ADHD (attention deficit hyperactivity disorder)     Past Surgical History:  Procedure Laterality Date   CESAREAN SECTION N/A 07/10/2021   Procedure: CESAREAN SECTION;  Surgeon: Mitchel Honour, DO;  Location: [facility] LD [facility]S;  Service: Obstetrics;  Laterality: N/A;    Family History  Problem Relation Age of Onset   Healthy Mother    Healthy Father    ADD / ADHD Father    Healthy Maternal Grandmother    Healthy Maternal Grandfather    Healthy Paternal Grandmother    Healthy Paternal Grandfather    Healthy Sister    ADD / ADHD Sister    Healthy Sister    ADD / ADHD Sister     Social History   Tobacco Use   Smoking status: Never   Smokeless tobacco: Never  Vaping Use   Vaping status: Former   Start date: 11/01/2020   Substances: Nicotine, Flavoring  Substance Use Topics   Alcohol use: Not Currently    Comment: stopped after finding out she was pregnant   Drug use: No    Allergies: No Known  Allergies  Medications Prior to Admission  Medication Sig Dispense Refill Last Dose   Prenatal Vit-Fe Fumarate-FA (PRENATAL MULTIVITAMIN) TABS tablet Take 1 tablet by mouth daily at 12 noon.   07/30/2023 at 0800    Review of Systems  Constitutional: Negative.   HENT: Negative.    Eyes: Negative.   Respiratory: Negative.    Cardiovascular: Negative.   Gastrointestinal: Negative.   Endocrine: Negative.   Genitourinary: Negative.        (+) FM  Musculoskeletal: Negative.   Skin: Negative.   Allergic/Immunologic: Negative.   Neurological: Negative.   Hematological: Negative.   Psychiatric/Behavioral: Negative.     Physical Exam   Blood pressure 114/68, pulse 89, temperature 98.6 F (37 C), temperature source Oral, resp. rate 18, height 5\' 4"  (1.626 m), weight 75.3 kg, last menstrual period 11/07/2022, SpO2 98%.  Physical Exam Vitals and nursing note reviewed.  Constitutional:      Appearance: Normal appearance. She is normal weight.  Cardiovascular:     Rate and Rhythm: Normal rate.  Pulmonary:  Effort: Pulmonary effort is normal.  Abdominal:     Palpations: Abdomen is soft.     Tenderness: There is no abdominal tenderness.  Genitourinary:    Comments: deferred Musculoskeletal:        General: No swelling, tenderness, deformity or signs of injury. Normal range of motion.     Right lower leg: No edema.     Left lower leg: No edema.  Skin:    General: Skin is warm and dry.     Findings: No bruising or erythema.  Neurological:     Mental Status: She is alert and oriented to person, place, and time.  Psychiatric:        Mood and Affect: Mood normal.        Behavior: Behavior normal.        Thought Content: Thought content normal.        Judgment: Judgment normal.    REACTIVE NST - FHR: 135 bpm / moderate variability / accels present / decels absent** / TOCO: irregular UC's with UI noted   **variable decel x 1 with immediate recovery after maternal position  change MAU Course  Procedures  MDM Prolonged EFM OB Limited MF U/S     Assessment and Plan  1. Traumatic injury during pregnancy in third trimester - Information provided on preventing injuries in pregnancy and FKC  - Advised to return to MAU: If you have heavy bleeding that soaks through more that 2 pads per hour for an hour or more If you bleed so much that you feel like you might pass out or you do pass out If you have significant abdominal pain that is not improved with Tylenol 1000 mg every 8 hours as needed for pain If you develop a fever > 100.5   2. [redacted] weeks gestation of pregnancy   - Discharge patient - Keep scheduled appt with [facility] on Thursday 08/05/2023 - Patient verbalized an understanding of the plan of care and agrees.    Raelyn Mora, CNM 07/31/2023, 3:11 PM

## 2023-07-31 NOTE — MAU Note (Addendum)
..  Heather Munoz is a 32 y.o. at [redacted]w[redacted]d here in MAU reporting a fall around 7. Rolled left ankle and fell to ground onto left wrist and arm on grassy surface. Some soreness in left wrist and shoulder. Endorses normal fetal movement. Denies vaginal bleeding, leaking of fluid, contractions, dizziness, lightheadedness.    Pain score: soreness 0/10 in left wrist/shoulder Blood pressure: 113/69 HR: 91 SpO2: 98 RR: 20 Temp: 98.6 FHT:120 Lab orders placed from triage:  UA

## 2023-08-04 ENCOUNTER — Encounter (HOSPITAL_COMMUNITY): Payer: Self-pay

## 2023-08-04 NOTE — Patient Instructions (Addendum)
Heather Munoz  08/04/2023   Your procedure is scheduled on:  08/18/2023  Arrive at 0800 at Entrance C on [address] at [facility]  and [employer]. You are invited to use the FREE valet parking or use the Visitor's parking deck.  Pick up the phone at the desk and dial [phone].  Call this number if you have problems the morning of surgery: [phone]  Remember:   Do not eat food:(After Midnight) Desps de medianoche.  Do not drink clear liquids: (After Midnight) Desps de medianoche.  Take these medicines the morning of surgery with A SIP OF WATER:  none   Do not wear jewelry, make-up or nail polish.  Do not wear lotions, powders, or perfumes. Do not wear deodorant.  Do not shave 48 hours prior to surgery.  Do not bring valuables to the hospital.  [facility] is not   responsible for any belongings or valuables brought to the hospital.  Contacts, dentures or bridgework may not be worn into surgery.  Leave suitcase in the car. After surgery it may be brought to your room.  For patients admitted to the hospital, checkout time is 11:00 AM the day of              discharge.      Please read over the following fact sheets that you were given:     Preparing for Surgery

## 2023-08-15 ENCOUNTER — Inpatient Hospital Stay (HOSPITAL_COMMUNITY)
Admission: AD | Admit: 2023-08-15 | Discharge: 2023-08-17 | DRG: 807 | Disposition: A | Discharge status: Home / Self Care | Payer: Managed Care, Other (non HMO) | Attending: Obstetrics and Gynecology | Admitting: Obstetrics and Gynecology

## 2023-08-15 ENCOUNTER — Inpatient Hospital Stay (HOSPITAL_COMMUNITY): Payer: Managed Care, Other (non HMO) | Admitting: Anesthesiology

## 2023-08-15 ENCOUNTER — Other Ambulatory Visit: Payer: Self-pay

## 2023-08-15 ENCOUNTER — Encounter (HOSPITAL_COMMUNITY): Payer: Self-pay | Admitting: Obstetrics and Gynecology

## 2023-08-15 DIAGNOSIS — O4292 Full-term premature rupture of membranes, unspecified as to length of time between rupture and onset of labor: Principal | ICD-10-CM | POA: Diagnosis present

## 2023-08-15 DIAGNOSIS — Z3689 Encounter for other specified antenatal screening: Secondary | ICD-10-CM | POA: Diagnosis not present

## 2023-08-15 DIAGNOSIS — O480 Post-term pregnancy: Secondary | ICD-10-CM | POA: Diagnosis present

## 2023-08-15 DIAGNOSIS — Z3A40 40 weeks gestation of pregnancy: Secondary | ICD-10-CM

## 2023-08-15 DIAGNOSIS — O4290 Premature rupture of membranes, unspecified as to length of time between rupture and onset of labor, unspecified weeks of gestation: Secondary | ICD-10-CM

## 2023-08-15 DIAGNOSIS — O34219 Maternal care for unspecified type scar from previous cesarean delivery: Secondary | ICD-10-CM | POA: Diagnosis present

## 2023-08-15 DIAGNOSIS — Z3490 Encounter for supervision of normal pregnancy, unspecified, unspecified trimester: Secondary | ICD-10-CM

## 2023-08-15 LAB — POCT FERN TEST: POCT Fern Test: POSITIVE

## 2023-08-15 LAB — CBC
HCT: 34.5 % — ABNORMAL LOW (ref 36.0–46.0)
Hemoglobin: 11.6 g/dL — ABNORMAL LOW (ref 12.0–15.0)
MCH: 31.6 pg (ref 26.0–34.0)
MCHC: 33.6 g/dL (ref 30.0–36.0)
MCV: 94 fL (ref 80.0–100.0)
Platelets: 160 10*3/uL (ref 150–400)
RBC: 3.67 MIL/uL — ABNORMAL LOW (ref 3.87–5.11)
RDW: 12.8 % (ref 11.5–15.5)
WBC: 12.2 10*3/uL — ABNORMAL HIGH (ref 4.0–10.5)
nRBC: 0 % (ref 0.0–0.2)

## 2023-08-15 LAB — TYPE AND SCREEN
ABO/RH(D): B POS
Antibody Screen: NEGATIVE

## 2023-08-15 LAB — HIV ANTIBODY (ROUTINE TESTING W REFLEX): HIV Screen 4th Generation wRfx: NONREACTIVE

## 2023-08-15 MED ORDER — TERBUTALINE SULFATE 1 MG/ML IJ SOLN: 0.2500 mg | Freq: Once | INTRAMUSCULAR | Status: DC | PRN

## 2023-08-15 MED ORDER — ZOLPIDEM TARTRATE 5 MG PO TABS: 5.0000 mg | ORAL_TABLET | Freq: Every evening | ORAL | Status: DC | PRN

## 2023-08-15 MED ORDER — OXYCODONE-ACETAMINOPHEN 5-325 MG PO TABS: 1.0000 | ORAL_TABLET | ORAL | Status: DC | PRN

## 2023-08-15 MED ORDER — OXYTOCIN-SODIUM CHLORIDE 30-0.9 UT/500ML-% IV SOLN: 1.0000 m[IU]/min | INTRAVENOUS | Status: DC

## 2023-08-15 MED ORDER — PHENYLEPHRINE 80 MCG/ML (10ML) SYRINGE FOR IV PUSH (FOR BLOOD PRESSURE SUPPORT)
PREFILLED_SYRINGE | INTRAVENOUS | Status: AC
  Filled 2023-08-15: qty 10

## 2023-08-15 MED ORDER — FENTANYL CITRATE (PF) 100 MCG/2ML IJ SOLN: 50.0000 ug | INTRAMUSCULAR | Status: DC | PRN

## 2023-08-15 MED ORDER — PHENYLEPHRINE 80 MCG/ML (10ML) SYRINGE FOR IV PUSH (FOR BLOOD PRESSURE SUPPORT)
80.0000 ug | PREFILLED_SYRINGE | INTRAVENOUS | Status: DC | PRN
  Administered 2023-08-16 (×2): 80 ug via INTRAVENOUS

## 2023-08-15 MED ORDER — OXYTOCIN BOLUS FROM INFUSION
333.0000 mL | Freq: Once | INTRAVENOUS | Status: AC
  Administered 2023-08-16: 333 mL via INTRAVENOUS

## 2023-08-15 MED ORDER — HYDROXYZINE HCL 50 MG PO TABS: 50.0000 mg | ORAL_TABLET | Freq: Four times a day (QID) | ORAL | Status: DC | PRN

## 2023-08-15 MED ORDER — LACTATED RINGERS IV SOLN: 500.0000 mL | INTRAVENOUS | Status: DC | PRN

## 2023-08-15 MED ORDER — LIDOCAINE HCL (PF) 1 % IJ SOLN: 30.0000 mL | INTRAMUSCULAR | Status: DC | PRN

## 2023-08-15 MED ORDER — LACTATED RINGERS IV SOLN: 500.0000 mL | Freq: Once | INTRAVENOUS | Status: DC

## 2023-08-15 MED ORDER — OXYCODONE-ACETAMINOPHEN 5-325 MG PO TABS: 2.0000 | ORAL_TABLET | ORAL | Status: DC | PRN

## 2023-08-15 MED ORDER — OXYTOCIN-SODIUM CHLORIDE 30-0.9 UT/500ML-% IV SOLN
2.5000 [IU]/h | INTRAVENOUS | Status: DC
  Administered 2023-08-16: 2.5 [IU]/h via INTRAVENOUS
  Filled 2023-08-15: qty 500

## 2023-08-15 MED ORDER — SOD CITRATE-CITRIC ACID 500-334 MG/5ML PO SOLN
30.0000 mL | ORAL | Status: DC | PRN
  Filled 2023-08-15: qty 30

## 2023-08-15 MED ORDER — LACTATED RINGERS IV SOLN: INTRAVENOUS | Status: DC

## 2023-08-15 MED ORDER — EPHEDRINE 5 MG/ML INJ: 10.0000 mg | INTRAVENOUS | Status: DC | PRN

## 2023-08-15 MED ORDER — PHENYLEPHRINE 80 MCG/ML (10ML) SYRINGE FOR IV PUSH (FOR BLOOD PRESSURE SUPPORT): 80.0000 ug | PREFILLED_SYRINGE | INTRAVENOUS | Status: DC | PRN

## 2023-08-15 MED ORDER — DIPHENHYDRAMINE HCL 50 MG/ML IJ SOLN: 12.5000 mg | INTRAMUSCULAR | Status: DC | PRN

## 2023-08-15 MED ORDER — FENTANYL-BUPIVACAINE-NACL 0.5-0.125-0.9 MG/250ML-% EP SOLN
12.0000 mL/h | EPIDURAL | Status: DC | PRN
  Administered 2023-08-15: 12 mL/h via EPIDURAL
  Filled 2023-08-15: qty 250

## 2023-08-15 MED ORDER — ONDANSETRON HCL 4 MG/2ML IJ SOLN
4.0000 mg | Freq: Four times a day (QID) | INTRAMUSCULAR | Status: DC | PRN
  Administered 2023-08-16: 4 mg via INTRAVENOUS
  Filled 2023-08-15: qty 2

## 2023-08-15 MED ORDER — LIDOCAINE HCL (PF) 1 % IJ SOLN
INTRAMUSCULAR | Status: DC | PRN
  Administered 2023-08-15 (×2): 4 mL via EPIDURAL

## 2023-08-15 MED ORDER — ACETAMINOPHEN 325 MG PO TABS: 650.0000 mg | ORAL_TABLET | ORAL | Status: DC | PRN

## 2023-08-15 MED ORDER — EPHEDRINE 5 MG/ML INJ
10.0000 mg | INTRAVENOUS | Status: AC | PRN
  Administered 2023-08-16 (×2): 10 mg via INTRAVENOUS
  Filled 2023-08-15: qty 5

## 2023-08-15 NOTE — H&P (Signed)
Heather Munoz is a 32 y.o. female presenting for PROM and early labor. Patient strongly desires TOLAC. Hx prior CS for nrFHT.    OB History     Gravida  3   Para  1   Term  1   Preterm  0   AB  1   Living  1      SAB  0   IAB  1   Ectopic  0   Multiple  0   Live Births  1          Past Medical History:  Diagnosis Date   ADHD (attention deficit hyperactivity disorder)    Past Surgical History:  Procedure Laterality Date   CESAREAN SECTION N/A 07/10/2021   Procedure: CESAREAN SECTION;  Surgeon: Mitchel Honour, DO;  Location: [facility] LD ORS;  Service: Obstetrics;  Laterality: N/A;   Family History: family history includes ADD / ADHD in her father, sister, and sister; Healthy in her father, maternal grandfather, maternal grandmother, mother, paternal grandfather, paternal grandmother, sister, and sister. Social History:  reports that she has never smoked. She has never used smokeless tobacco. She reports that she does not currently use alcohol. She reports that she does not use drugs.     Maternal Diabetes: No Genetic Screening: Normal Maternal Ultrasounds/Referrals: Normal Fetal Ultrasounds or other Referrals:  None Maternal Substance Abuse:  No Significant Maternal Medications:  None Significant Maternal Lab Results:  Group B Strep negative Number of Prenatal Visits:greater than 3 verified prenatal visits Other Comments:  None  Review of Systems History Dilation: 1.5 Effacement (%): 70, 80 Exam by:: Marvel Plan RN Blood pressure 117/80, pulse 82, temperature 97.8 F (36.6 C), temperature source Oral, resp. rate 18, height 5\' 4"  (1.626 m), weight 75.6 kg, last menstrual period 11/07/2022, SpO2 98%. Exam Physical Exam  NAD, A&O NWOB Abd soft, nondistended, gravid  Prenatal labs: ABO, Rh: --/--/PENDING (08/25 1755) Antibody: PENDING (08/25 1755) Rubella: Immune (01/17 0000) RPR: Nonreactive (01/17 0000)  HBsAg: Negative (01/17 0000)  HIV:  Non-reactive (01/17 0000)  GBS: Negative/-- (08/01 0000)   Assessment/Plan: 32 yo G3P1011 presenting with SROM and early labor. Options d/w pt. Risks of VOTLAC vs CS d/w pt. We reviewed risks of uterine rupture and the 10% chance of neonatal morbidity/mortality if that were to occur. We discussed the possibility of failure and if failure happens, outcomes are worse. We discussed risks of a CS including infection, bleeding, damage to surrounding structures, the need for additional procedures, abnormal placentation in the future, and the requirement of CS in the future. After careful consideration, she elects a VTOLAC. Given she is contracting, she would like to defer augmentation even though her cervix is unchanged. I d/w pt r/f of waiting vs starting pitocin. After careful conversation, she elects expectant management and start pitocin after 4-6 hours if no cervical change. I discussed higher risk of uterine rupture/complications with pitocin use. GSB negative.     [name] 08/15/2023, 7:03 PM

## 2023-08-15 NOTE — Anesthesia Procedure Notes (Signed)
Epidural Patient location during procedure: OB Start time: 08/15/2023 10:21 PM End time: 08/15/2023 10:24 PM  Staffing Anesthesiologist: Kaylyn Layer, MD Performed: anesthesiologist   Preanesthetic Checklist Completed: patient identified, IV checked, risks and benefits discussed, monitors and equipment checked, pre-op evaluation and timeout performed  Epidural Patient position: sitting Prep: DuraPrep and site prepped and draped Patient monitoring: continuous pulse ox, blood pressure and heart rate Approach: midline Location: L3-L4 Injection technique: LOR air  Needle:  Needle type: Tuohy  Needle gauge: 17 G Needle length: 9 cm Needle insertion depth: 5 cm Catheter type: closed end flexible Catheter size: 19 Gauge Catheter at skin depth: 10 cm Test dose: negative and Other (1% lidocaine)  Assessment Events: blood not aspirated, no cerebrospinal fluid, injection not painful, no injection resistance, no paresthesia and negative IV test  Additional Notes Patient identified. Risks, benefits, and alternatives discussed with patient including but not limited to bleeding, infection, nerve damage, paralysis, failed block, incomplete pain control, headache, blood pressure changes, nausea, vomiting, reactions to medication, itching, and postpartum back pain. Confirmed with bedside nurse the patient's most recent platelet count. Confirmed with patient that they are not currently taking any anticoagulation, have any bleeding history, or any family history of bleeding disorders. Patient expressed understanding and wished to proceed. All questions were answered. Sterile technique was used throughout the entire procedure. Please see nursing notes for vital signs.   Crisp LOR on first pass. Test dose was given through epidural catheter and negative prior to continuing to dose epidural or start infusion. Warning signs of high block given to the patient including shortness of breath,  tingling/numbness in hands, complete motor block, or any concerning symptoms with instructions to call for help. Patient was given instructions on fall risk and not to get out of bed. All questions and concerns addressed with instructions to call with any issues or inadequate analgesia.  Reason for block:procedure for pain

## 2023-08-15 NOTE — MAU Note (Signed)
Heather Munoz is a 33 y.o. at [redacted]w[redacted]d here in MAU reporting: had some thick mucous d/c yesterday.  Had a little d/c, unsure of water leaking or is more of the mucous stuff.  Is more watery now and sometimes pink tinged. contractions started around 0330, 30sec every .  Now last a min, every .  Reprots +FM Wed was 1+ Onset of complaint: 0330 Pain score: 7 Vitals:   08/15/23 1640  BP: 125/77  Pulse: 91  Resp: 18  Temp: 98.5 F (36.9 C)  SpO2: 98%     FHT:140 Lab orders placed from triage:  fern

## 2023-08-15 NOTE — MAU Provider Note (Signed)
Event Date/Time   First Provider Initiated Contact with Patient 08/15/23 1703       S: [name] is a 32 y.o. G3P1011 at [redacted]w[redacted]d  who presents to MAU today complaining of leaking of fluid since this morning. She denies vaginal bleeding. She endorses contractions. She reports normal fetal movement.    O: BP 125/77 (BP Location: Right Arm)   Pulse 91   Temp 98.5 F (36.9 C) (Oral)   Resp 18   Ht 5\' 4"  (1.626 m)   Wt 75.6 kg   LMP 11/07/2022   SpO2 98%   BMI 28.61 kg/m  GENERAL: Well-developed, well-nourished female in no acute distress.  HEAD: Normocephalic, atraumatic.  CHEST: Normal effort of breathing, regular heart rate ABDOMEN: Soft, nontender, gravid PELVIC: Normal external female genitalia. Vagina is pink and rugated. Cervix with normal contour, no lesions. Normal discharge. Scant pooling clear fluid & mucoid discharge.   Cervical exam:  Dilation: 1.5 Effacement (%): 70, 80 Cervical Position: Posterior Exam by:: Marvel Plan RN   Fetal Monitoring: Baseline: 120 Variability: moderate Accelerations: 15x15 Decelerations: no Contractions: Q 6-7 minutes  Results for orders placed or performed during the hospital encounter of 08/15/23 (from the past 24 hour(s))  Fern Test     Status: Abnormal   Collection Time: 08/15/23  5:25 PM  Result Value Ref Range   POCT Fern Test Positive = ruptured amniotic membanes      A: [facility] at [redacted]w[redacted]d  SROM  P: RN to call patient's OB for admission  Judeth Horn, NP 08/15/2023 5:45 PM

## 2023-08-15 NOTE — Anesthesia Preprocedure Evaluation (Signed)
Anesthesia Evaluation  Patient identified by MRN, date of birth, ID band Patient awake    Reviewed: Allergy & Precautions, Patient's Chart, lab work & pertinent test results  History of Anesthesia Complications Negative for: history of anesthetic complications  Airway Mallampati: II  TM Distance: >3 FB Neck ROM: Full    Dental no notable dental hx.    Pulmonary neg pulmonary ROS   Pulmonary exam normal        Cardiovascular negative cardio ROS Normal cardiovascular exam     Neuro/Psych negative neurological ROS     GI/Hepatic negative GI ROS, Neg liver ROS,,,  Endo/Other  negative endocrine ROS    Renal/GU negative Renal ROS  negative genitourinary   Musculoskeletal negative musculoskeletal ROS (+)    Abdominal   Peds  Hematology negative hematology ROS (+)   Anesthesia Other Findings Day of surgery medications reviewed with patient.  Reproductive/Obstetrics (+) Pregnancy (Hx of C/S x1)                             Anesthesia Physical Anesthesia Plan  ASA: 3  Anesthesia Plan: Epidural   Post-op Pain Management:    Induction:   PONV Risk Score and Plan: Treatment may vary due to age or medical condition  Airway Management Planned: Natural Airway  Additional Equipment: Fetal Monitoring  Intra-op Plan:   Post-operative Plan:   Informed Consent: I have reviewed the patients History and Physical, chart, labs and discussed the procedure including the risks, benefits and alternatives for the proposed anesthesia with the patient or authorized representative who has indicated his/her understanding and acceptance.       Plan Discussed with:   Anesthesia Plan Comments:        Anesthesia Quick Evaluation

## 2023-08-15 NOTE — Progress Notes (Signed)

## 2023-08-16 ENCOUNTER — Inpatient Hospital Stay (HOSPITAL_COMMUNITY)
Admission: RE | Admit: 2023-08-16 | Discharge: 2023-08-16 | Disposition: A | Discharge status: Home / Self Care | Payer: Managed Care, Other (non HMO) | Source: Ambulatory Visit

## 2023-08-16 ENCOUNTER — Encounter (HOSPITAL_COMMUNITY): Payer: Self-pay | Admitting: Obstetrics and Gynecology

## 2023-08-16 LAB — CBC
HCT: 32.2 % — ABNORMAL LOW (ref 36.0–46.0)
Hemoglobin: 10.8 g/dL — ABNORMAL LOW (ref 12.0–15.0)
MCH: 31.9 pg (ref 26.0–34.0)
MCHC: 33.5 g/dL (ref 30.0–36.0)
MCV: 95 fL (ref 80.0–100.0)
Platelets: 132 10*3/uL — ABNORMAL LOW (ref 150–400)
RBC: 3.39 MIL/uL — ABNORMAL LOW (ref 3.87–5.11)
RDW: 12.9 % (ref 11.5–15.5)
WBC: 17 10*3/uL — ABNORMAL HIGH (ref 4.0–10.5)
nRBC: 0 % (ref 0.0–0.2)

## 2023-08-16 LAB — RPR: RPR Ser Ql: NONREACTIVE

## 2023-08-16 MED ORDER — WITCH HAZEL-GLYCERIN EX PADS
1.0000 | MEDICATED_PAD | CUTANEOUS | Status: DC | PRN
  Administered 2023-08-16: 1 via TOPICAL

## 2023-08-16 MED ORDER — BENZOCAINE-MENTHOL 20-0.5 % EX AERO
1.0000 | INHALATION_SPRAY | CUTANEOUS | Status: DC | PRN
  Administered 2023-08-16: 1 via TOPICAL
  Filled 2023-08-16: qty 56

## 2023-08-16 MED ORDER — OXYCODONE HCL 5 MG PO TABS: 5.0000 mg | ORAL_TABLET | ORAL | Status: DC | PRN

## 2023-08-16 MED ORDER — WITCH HAZEL-GLYCERIN EX PADS: 1.0000 | MEDICATED_PAD | CUTANEOUS | Status: DC | PRN

## 2023-08-16 MED ORDER — OXYCODONE HCL 5 MG PO TABS: 10.0000 mg | ORAL_TABLET | ORAL | Status: DC | PRN

## 2023-08-16 MED ORDER — DIBUCAINE (PERIANAL) 1 % EX OINT: 1.0000 | TOPICAL_OINTMENT | CUTANEOUS | Status: DC | PRN

## 2023-08-16 MED ORDER — ONDANSETRON HCL 4 MG PO TABS: 4.0000 mg | ORAL_TABLET | ORAL | Status: DC | PRN

## 2023-08-16 MED ORDER — ZOLPIDEM TARTRATE 5 MG PO TABS: 5.0000 mg | ORAL_TABLET | Freq: Every evening | ORAL | Status: DC | PRN

## 2023-08-16 MED ORDER — SENNOSIDES-DOCUSATE SODIUM 8.6-50 MG PO TABS: 2.0000 | ORAL_TABLET | Freq: Every day | ORAL | Status: DC

## 2023-08-16 MED ORDER — PRENATAL MULTIVITAMIN CH: 1.0000 | ORAL_TABLET | Freq: Every day | ORAL | Status: DC

## 2023-08-16 MED ORDER — OXYTOCIN-SODIUM CHLORIDE 30-0.9 UT/500ML-% IV SOLN
1.0000 m[IU]/min | INTRAVENOUS | Status: DC
  Administered 2023-08-16: 2 m[IU]/min via INTRAVENOUS

## 2023-08-16 MED ORDER — SIMETHICONE 80 MG PO CHEW: 80.0000 mg | CHEWABLE_TABLET | ORAL | Status: DC | PRN

## 2023-08-16 MED ORDER — ONDANSETRON HCL 4 MG/2ML IJ SOLN: 4.0000 mg | INTRAMUSCULAR | Status: DC | PRN

## 2023-08-16 MED ORDER — PRENATAL MULTIVITAMIN CH
1.0000 | ORAL_TABLET | Freq: Every day | ORAL | Status: DC
  Administered 2023-08-16 – 2023-08-17 (×2): 1 via ORAL
  Filled 2023-08-16 (×2): qty 1

## 2023-08-16 MED ORDER — BENZOCAINE-MENTHOL 20-0.5 % EX AERO: 1.0000 | INHALATION_SPRAY | CUTANEOUS | Status: DC | PRN

## 2023-08-16 MED ORDER — COCONUT OIL OIL: 1.0000 | TOPICAL_OIL | Status: DC | PRN

## 2023-08-16 MED ORDER — TETANUS-DIPHTH-ACELL PERTUSSIS 5-2.5-18.5 LF-MCG/0.5 IM SUSY: 0.5000 mL | PREFILLED_SYRINGE | Freq: Once | INTRAMUSCULAR | Status: DC

## 2023-08-16 MED ORDER — SENNOSIDES-DOCUSATE SODIUM 8.6-50 MG PO TABS
2.0000 | ORAL_TABLET | ORAL | Status: DC
  Administered 2023-08-16 – 2023-08-17 (×2): 2 via ORAL
  Filled 2023-08-16 (×2): qty 2

## 2023-08-16 MED ORDER — IBUPROFEN 600 MG PO TABS
600.0000 mg | ORAL_TABLET | Freq: Four times a day (QID) | ORAL | Status: DC
  Administered 2023-08-16 – 2023-08-17 (×4): 600 mg via ORAL
  Filled 2023-08-16 (×4): qty 1

## 2023-08-16 MED ORDER — DIPHENHYDRAMINE HCL 25 MG PO CAPS: 25.0000 mg | ORAL_CAPSULE | Freq: Four times a day (QID) | ORAL | Status: DC | PRN

## 2023-08-16 MED ORDER — IBUPROFEN 600 MG PO TABS: 600.0000 mg | ORAL_TABLET | Freq: Four times a day (QID) | ORAL | Status: DC

## 2023-08-16 MED ORDER — ACETAMINOPHEN 325 MG PO TABS
650.0000 mg | ORAL_TABLET | ORAL | Status: DC | PRN
  Administered 2023-08-17: 650 mg via ORAL
  Filled 2023-08-16: qty 2

## 2023-08-16 MED ORDER — ACETAMINOPHEN 325 MG PO TABS: 650.0000 mg | ORAL_TABLET | ORAL | Status: DC | PRN

## 2023-08-16 NOTE — Anesthesia Postprocedure Evaluation (Signed)
Anesthesia Post Note  Patient: Heather Munoz  Procedure(s) Performed: AN AD HOC LABOR EPIDURAL     Patient location during evaluation: Mother Baby Anesthesia Type: Epidural Level of consciousness: awake Pain management: satisfactory to patient Vital Signs Assessment: post-procedure vital signs reviewed and stable Respiratory status: spontaneous breathing Cardiovascular status: stable Anesthetic complications: no  No notable events documented.  Last Vitals:  Vitals:   08/16/23 1327 08/16/23 1552  BP: 106/77 108/75  Pulse: 97 98  Resp: 18 16  Temp: 36.9 C 36.8 C  SpO2: 99% 99%    Last Pain:  Vitals:   08/16/23 1552  TempSrc: Oral  PainSc:    Pain Goal:                   [employer]

## 2023-08-16 NOTE — Procedures (Signed)
6.5/90/-2 

## 2023-08-16 NOTE — Progress Notes (Signed)
Labor Progress Note  C/+2 station. Pushing well, continue. FHT remains reassuring.  Jule Economy, MD

## 2023-08-16 NOTE — Progress Notes (Signed)
Labor Progress Note  Feeling overall well, some lightheadedness and nausea. Has been intermittently hypotensive despite phenylephrine. RN reaching out to anesthesia.  Cervix 7/90/-2, forebag AROMed with meconium We discussed implications of this. IUPC placed after discussion with patient and partner. Pending contraction pattern, will start pitocin as tolerated to adequacy.   Jule Economy, MD

## 2023-08-16 NOTE — Lactation Note (Signed)
This note was copied from a baby's chart. Lactation Consultation Note  Patient Name: Heather Munoz [id number] Date: 08/16/2023 Age:31 hours Reason for consult: Initial assessment;Term.  P2, term female infant. LC entered the room infant was cuing to BF, Birth Parent open to trying football hold position mostly been using the cradle hold, Birth Parent felt infant does better with football position. Birth Parent latched infant on her left breast using the football hold, infant was on and off the breast and was still BF after 15 minutes. Birth Parent will continue to work on latching infant at the breast, she knows how to hand express and give infant back EBM if infant does not latch. Birth Parent knows to call RN/LC for further latch assistance if needed. LC discussed the importance of maternal rest, diet and hydration. Birth Parent was made aware of O/P services, breastfeeding support groups, community resources, and our phone # for post-discharge questions.    Current feeding plan: 1- Continue to breastfeed infant by cues, on demand, 2-3 hours, skin to skin. 2- Birth Parent knows to ask RN/LC for further latch assistance if needed.  Maternal Data Has patient been taught Hand Expression?: Yes Per Birth Parent, her 1st child had latch difficulties and she pumped only for 3 months. Her first child is currently 76 years old.   Feeding Mother's Current Feeding Choice: Breast Milk  LATCH Score Latch: Repeated attempts needed to sustain latch, nipple held in mouth throughout feeding, stimulation needed to elicit sucking reflex.  Audible Swallowing: A few with stimulation  Type of Nipple: Everted at rest and after stimulation  Comfort (Breast/Nipple): Soft / non-tender  Hold (Positioning): Assistance needed to correctly position infant at breast and maintain latch.  LATCH Score: 7   Lactation Tools Discussed/Used    Interventions Interventions: Breast feeding basics reviewed;Assisted with  latch;Skin to skin;Breast compression;Hand express;Adjust position;Support pillows;Position options;Expressed milk;Education;LC Services brochure  Discharge Pump: DEBP;Personal  Consult Status Consult Status: Follow-up    Frederico Hamman 08/16/2023, 8:13 PM

## 2023-08-17 LAB — CBC
HCT: 28.6 % — ABNORMAL LOW (ref 36.0–46.0)
Hemoglobin: 9.5 g/dL — ABNORMAL LOW (ref 12.0–15.0)
MCH: 31.3 pg (ref 26.0–34.0)
MCHC: 33.2 g/dL (ref 30.0–36.0)
MCV: 94.1 fL (ref 80.0–100.0)
Platelets: 133 10*3/uL — ABNORMAL LOW (ref 150–400)
RBC: 3.04 MIL/uL — ABNORMAL LOW (ref 3.87–5.11)
RDW: 13.2 % (ref 11.5–15.5)
WBC: 11 10*3/uL — ABNORMAL HIGH (ref 4.0–10.5)
nRBC: 0 % (ref 0.0–0.2)

## 2023-08-17 NOTE — Lactation Note (Signed)
This note was copied from a baby's chart. Lactation Consultation Note  Patient Name: Heather Munoz [id number] Date: 08/17/2023 Age:31 hours Reason for consult: Follow-up assessment (LC reviewed engorgement prevention and tx early , storage of breastmilk and provided a hand pump with flange check,)   Maternal Data Has patient been taught Hand Expression?: Yes Does the patient have breastfeeding experience prior to this delivery?: Yes How long did the patient breastfeed?: per mom had challenges - breast/ formula , mastitis x 1 , pumped off alot of milk and froze it - baby didn't end up liking pumped milk  Feeding Mother's Current Feeding Choice: Breast Milk and Formula  LATCH Score - 7-8   Lactation Tools Discussed/Used Tools: Pump;Flanges Flange Size: 21;24;27;Other (comment) (#21 F to snug , #24 F per mom feels comfortable and #27 F provided for when the milk comes in if needed) Breast pump type: Manual Pump Education: Milk Storage;Setup, frequency, and cleaning  Interventions Interventions: Breast feeding basics reviewed;Education;LC Services brochure;CDC Guidelines for Breast Pump Cleaning  Discharge Discharge Education: Engorgement and breast care;Other (comment) (1st consult today) Pump: Personal;Manual;DEBP WIC Program: No  Consult Status Consult Status: Complete    Kathrin Greathouse 08/17/2023, 2:47 PM

## 2023-08-17 NOTE — Discharge Summary (Signed)
Postpartum Discharge Summary  Date of Service August 17, 2023     Patient Name: Heather Munoz DOB: November 29, 1991 MRN: [id number]  Date of admission: 08/15/2023 Delivery date:08/16/2023 Delivering provider: Tawni Levy Date of discharge: 08/17/2023  Admitting diagnosis: Pregnancy [Z34.90] Intrauterine pregnancy: [redacted]w[redacted]d     Secondary diagnosis:  Principal Problem:   Pregnancy  Additional problems: none    Discharge diagnosis: Term Pregnancy Delivered    and VBAC                                          Post partum procedures: none Augmentation: AROM and Pitocin Complications: None  Hospital course: Onset of Labor With Vaginal Delivery      32 y.o. yo [id number] at [redacted]w[redacted]d was admitted in Latent Labor on 08/15/2023. Labor course was complicated bymeconium and needed vacuum for VBAC  Membrane Rupture Time/Date: 11:00 AM,08/15/2023  Delivery Method:Vaginal, Vacuum (Extractor) Operative Delivery:Device used:mushroom Indication: Fetal indications Episiotomy: None Lacerations:  2nd degree;Perineal Patient had a postpartum course complicated by nothing.  She is ambulating, tolerating a regular diet, passing flatus, and urinating well. Patient is discharged home in stable condition on 08/17/23.  Newborn Data: Birth date:08/16/2023 Birth time:10:36 AM Gender:Female Living status:Living Apgars:8 ,9  Weight:3230 g  Magnesium Sulfate received: No BMZ received: No Rhophylac:N/A MMR:N/A T-DaP:Given prenatally Flu: N/A Transfusion:Yes  Physical exam  Vitals:   08/16/23 1552 08/16/23 1744 08/16/23 2244 08/17/23 0300  BP: 108/75 115/73 118/72 (!) 90/58  Pulse: 98 97 91 70  Resp: 16 18 16 17   Temp: 98.3 F (36.8 C) 98.8 F (37.1 C) 98.4 F (36.9 C) 98.3 F (36.8 C)  TempSrc: Oral Oral Oral Oral  SpO2: 99% 98%    Weight:      Height:       General: alert, cooperative, and no distress Lochia: appropriate Uterine Fundus: firm Incision: Healing well with no significant  drainage DVT Evaluation: No evidence of DVT seen on physical exam. Labs: Lab Results  Component Value Date   WBC 11.0 (H) 08/17/2023   HGB 9.5 (L) 08/17/2023   HCT 28.6 (L) 08/17/2023   MCV 94.1 08/17/2023   PLT 133 (L) 08/17/2023      Latest Ref Rng & Units 11/09/2020    6:59 PM  CMP  Glucose 70 - 99 mg/dL 213   BUN 6 - 20 mg/dL 12   Creatinine 0.86 - 1.00 mg/dL 5.78   Sodium 469 - 629 mmol/L 137   Potassium 3.5 - 5.1 mmol/L 3.8   Chloride 98 - 111 mmol/L 106   CO2 22 - 32 mmol/L 20   Calcium 8.9 - 10.3 mg/dL 9.2   Total Protein 6.5 - 8.1 g/dL 6.7   Total Bilirubin 0.3 - 1.2 mg/dL 0.7   Alkaline Phos 38 - 126 U/L 28   AST 15 - 41 U/L 18   ALT 0 - 44 U/L 16    Edinburgh Score:    08/16/2023    5:44 PM  Edinburgh Postnatal Depression Scale Screening Tool  I have been able to laugh and see the funny side of things. 0  I have looked forward with enjoyment to things. 0  I have blamed myself unnecessarily when things went wrong. 1  I have been anxious or worried for no good reason. 1  I have felt scared or panicky for no good reason. 1  Things  have been getting on top of me. 0  I have been so unhappy that I have had difficulty sleeping. 0  I have felt sad or miserable. 0  I have been so unhappy that I have been crying. 0  The thought of harming myself has occurred to me. 0  Edinburgh Postnatal Depression Scale Total 3      After visit meds:  Allergies as of 08/17/2023   No Known Allergies      Medication List     STOP taking these medications    metformin 500 MG (OSM) 24 hr tablet Commonly known as: FORTAMET   ondansetron 4 MG tablet Commonly known as: ZOFRAN       TAKE these medications    prenatal multivitamin Tabs tablet Take 1 tablet by mouth daily at 12 noon.         Discharge home in stable condition Infant Feeding: Breast Infant Disposition:home with mother Discharge instruction: per After Visit Summary and Postpartum  booklet. Activity: Advance as tolerated. Pelvic rest for 6 weeks.  Diet: routine diet Anticipated Birth Control: Unsure Postpartum Appointment:6 weeks Additional Postpartum F/U:  not applicable Future Appointments:No future appointments. Follow up Visit:      08/17/2023 Jeani Hawking, MD

## 2023-08-17 NOTE — Lactation Note (Signed)
This note was copied from a baby's chart. Lactation Consultation Note  Patient Name: Heather Munoz [id number] Date: 08/17/2023 Age:31 hours Reason for consult: Follow-up assessment;Infant weight loss;Term;Nipple pain/trauma (2 % weight loss) Per mom  baby has been circ'd this am . Was fed 20 ml of formula in the nursery, 5 mins at the breast.  LC reviewed supply and demand and recommended offering the breast 1st if still hungry offer the 2nd breast, if satisfied hold off on the formula.  If feeding formula keep it low.  LC provided a hand pump and checked the flange, nipples pinky red and areola edema . LC instructed mom on the steps for latching, reverse pressure and the areola compressed well without the edema.  Mom mentioned she had mastitis with her 1st baby, LC reviewed prevention of sore nipples, engorgement and S/S of mastitis.  Mom aware she can call for assistance if needed.  Mom mentioned she feels good about the latching.   Maternal Data Has patient been taught Hand Expression?: Yes Does the patient have breastfeeding experience prior to this delivery?: Yes How long did the patient breastfeed?: per mom had challenges - breast/ formula , mastitis x 1 , pumped off alot of milk and froze it - baby didn't end up liking pumped milk  Feeding Mother's Current Feeding Choice: Breast Milk and Formula  LATCH Score - 7-8    Lactation Tools Discussed/Used Tools: Pump;Flanges Flange Size: 21;24;27;Other (comment) (#21 F to snug , #24 F per mom feels comfortable and #27 F provided for when the milk comes in if needed) Breast pump type: Manual Pump Education: Milk Storage;Setup, frequency, and cleaning  Interventions Interventions: Breast feeding basics reviewed;Education;LC Services brochure;CDC Guidelines for Breast Pump Cleaning  Discharge Discharge Education: Engorgement and breast care Pump: Personal;DEBP;Manual (per mm Spectra) WIC Program: No  Consult Status Consult  Status: Follow-up- PRN     Matilde Sprang [facility] 08/17/2023, 12:41 PM

## 2023-08-18 ENCOUNTER — Encounter (HOSPITAL_COMMUNITY): Admission: RE | Payer: Self-pay | Source: Home / Self Care

## 2023-08-18 ENCOUNTER — Inpatient Hospital Stay (HOSPITAL_COMMUNITY)
Admission: RE | Admit: 2023-08-18 | Payer: Managed Care, Other (non HMO) | Source: Home / Self Care | Admitting: Obstetrics and Gynecology

## 2023-08-18 SURGERY — Surgical Case
Anesthesia: Regional

## 2023-09-16 ENCOUNTER — Telehealth (HOSPITAL_COMMUNITY): Payer: Self-pay | Admitting: *Deleted

## 2023-09-16 NOTE — Telephone Encounter (Signed)
09/16/2023  Name: Argie Amsterdam MRN: [id number] DOB: Jul 21, 1991  Reason for Call:  Transition of Care Hospital Discharge Call  Contact Status: Patient Contact Status: Message  Language assistant needed:          Follow-Up Questions:    Inocente Salles Postnatal Depression Scale:  In the Past 7 Days:    PHQ2-9 Depression Scale:     Discharge Follow-up:    Post-discharge interventions: NA  Salena Saner, RN 09/16/2023 14:03

## 2025-02-08 ENCOUNTER — Ambulatory Visit: Admitting: Family
